# Patient Record
Sex: Female | Born: 1975 | Race: White | Hispanic: No | Smoking: Current every day smoker
Health system: Southern US, Community
[De-identification: ages and names within clinical notes are randomized; demographics above are authoritative.]

## PROBLEM LIST (undated history)

## (undated) DIAGNOSIS — K219 Gastro-esophageal reflux disease without esophagitis: Secondary | ICD-10-CM

## (undated) DIAGNOSIS — F32A Depression, unspecified: Secondary | ICD-10-CM

## (undated) DIAGNOSIS — J302 Other seasonal allergic rhinitis: Secondary | ICD-10-CM

## (undated) DIAGNOSIS — F419 Anxiety disorder, unspecified: Secondary | ICD-10-CM

## (undated) DIAGNOSIS — M1990 Unspecified osteoarthritis, unspecified site: Secondary | ICD-10-CM

## (undated) HISTORY — DX: Gastro-esophageal reflux disease without esophagitis: K21.9

## (undated) HISTORY — DX: Unspecified osteoarthritis, unspecified site: M19.90

## (undated) HISTORY — PX: OVARIAN CYST REMOVAL: SHX89

## (undated) HISTORY — DX: Other seasonal allergic rhinitis: J30.2

## (undated) HISTORY — PX: CHOLECYSTECTOMY: SHX55

## (undated) HISTORY — DX: Anxiety disorder, unspecified: F41.9

## (undated) HISTORY — DX: Depression, unspecified: F32.A

---

## 2002-06-01 ENCOUNTER — Emergency Department (HOSPITAL_COMMUNITY): Admission: EM | Admit: 2002-06-01 | Discharge: 2002-06-01 | Payer: Self-pay | Admitting: Emergency Medicine

## 2002-06-01 ENCOUNTER — Encounter: Payer: Self-pay | Admitting: Emergency Medicine

## 2003-01-20 ENCOUNTER — Emergency Department (HOSPITAL_COMMUNITY): Admission: EM | Admit: 2003-01-20 | Discharge: 2003-01-20 | Payer: Self-pay | Admitting: Emergency Medicine

## 2003-10-26 ENCOUNTER — Emergency Department (HOSPITAL_COMMUNITY): Admission: EM | Admit: 2003-10-26 | Discharge: 2003-10-26 | Payer: Self-pay | Admitting: Emergency Medicine

## 2005-12-19 ENCOUNTER — Emergency Department (HOSPITAL_COMMUNITY): Admission: EM | Admit: 2005-12-19 | Discharge: 2005-12-19 | Payer: Self-pay | Admitting: Emergency Medicine

## 2007-07-23 ENCOUNTER — Emergency Department: Payer: Self-pay | Admitting: Emergency Medicine

## 2013-12-03 ENCOUNTER — Emergency Department: Payer: Self-pay | Admitting: Emergency Medicine

## 2014-01-09 ENCOUNTER — Emergency Department: Payer: Self-pay | Admitting: Emergency Medicine

## 2014-01-09 LAB — CBC WITH DIFFERENTIAL/PLATELET
BASOS PCT: 0.2 %
Basophil #: 0 10*3/uL (ref 0.0–0.1)
EOS PCT: 3.2 %
Eosinophil #: 0.3 10*3/uL (ref 0.0–0.7)
HCT: 41.5 % (ref 35.0–47.0)
HGB: 14.3 g/dL (ref 12.0–16.0)
Lymphocyte #: 2.9 10*3/uL (ref 1.0–3.6)
Lymphocyte %: 36.5 %
MCH: 31.8 pg (ref 26.0–34.0)
MCHC: 34.6 g/dL (ref 32.0–36.0)
MCV: 92 fL (ref 80–100)
MONO ABS: 0.5 x10 3/mm (ref 0.2–0.9)
Monocyte %: 5.7 %
NEUTROS ABS: 4.4 10*3/uL (ref 1.4–6.5)
NEUTROS PCT: 54.4 %
PLATELETS: 183 10*3/uL (ref 150–440)
RBC: 4.51 10*6/uL (ref 3.80–5.20)
RDW: 12.9 % (ref 11.5–14.5)
WBC: 8 10*3/uL (ref 3.6–11.0)

## 2014-01-09 LAB — BASIC METABOLIC PANEL
Anion Gap: 5 — ABNORMAL LOW (ref 7–16)
BUN: 7 mg/dL (ref 7–18)
Calcium, Total: 8.6 mg/dL (ref 8.5–10.1)
Chloride: 105 mmol/L (ref 98–107)
Co2: 28 mmol/L (ref 21–32)
Creatinine: 0.75 mg/dL (ref 0.60–1.30)
EGFR (African American): >60
EGFR (Non-African Amer.): >60
GLUCOSE: 99 mg/dL (ref 65–99)
Osmolality: 274 (ref 275–301)
Potassium: 3.7 mmol/L (ref 3.5–5.1)
Sodium: 138 mmol/L (ref 136–145)

## 2014-01-09 LAB — SEDIMENTATION RATE: Erythrocyte Sed Rate: 4 mm/hr (ref 0–20)

## 2014-01-13 ENCOUNTER — Encounter: Payer: Self-pay | Admitting: Surgery

## 2014-01-16 ENCOUNTER — Emergency Department: Payer: Self-pay | Admitting: Emergency Medicine

## 2014-01-19 LAB — WOUND AEROBIC CULTURE

## 2014-02-07 ENCOUNTER — Encounter: Payer: Self-pay | Admitting: Surgery

## 2014-03-09 ENCOUNTER — Encounter: Payer: Self-pay | Admitting: Surgery

## 2014-06-07 ENCOUNTER — Emergency Department: Payer: Self-pay | Admitting: Internal Medicine

## 2014-08-12 IMAGING — CR RIGHT FOOT COMPLETE - 3+ VIEW
1 series · 3 of 3 positions shown · non-contrast
Comparison: 12/03/2013

CLINICAL DATA: Fell several weeks ago.  Right foot pain.

EXAM:
RIGHT FOOT COMPLETE - 3+ VIEW

[Series 1: x foot ap right · 0.14mm/px · 3 of 3 slices shown]
[im 1/3]
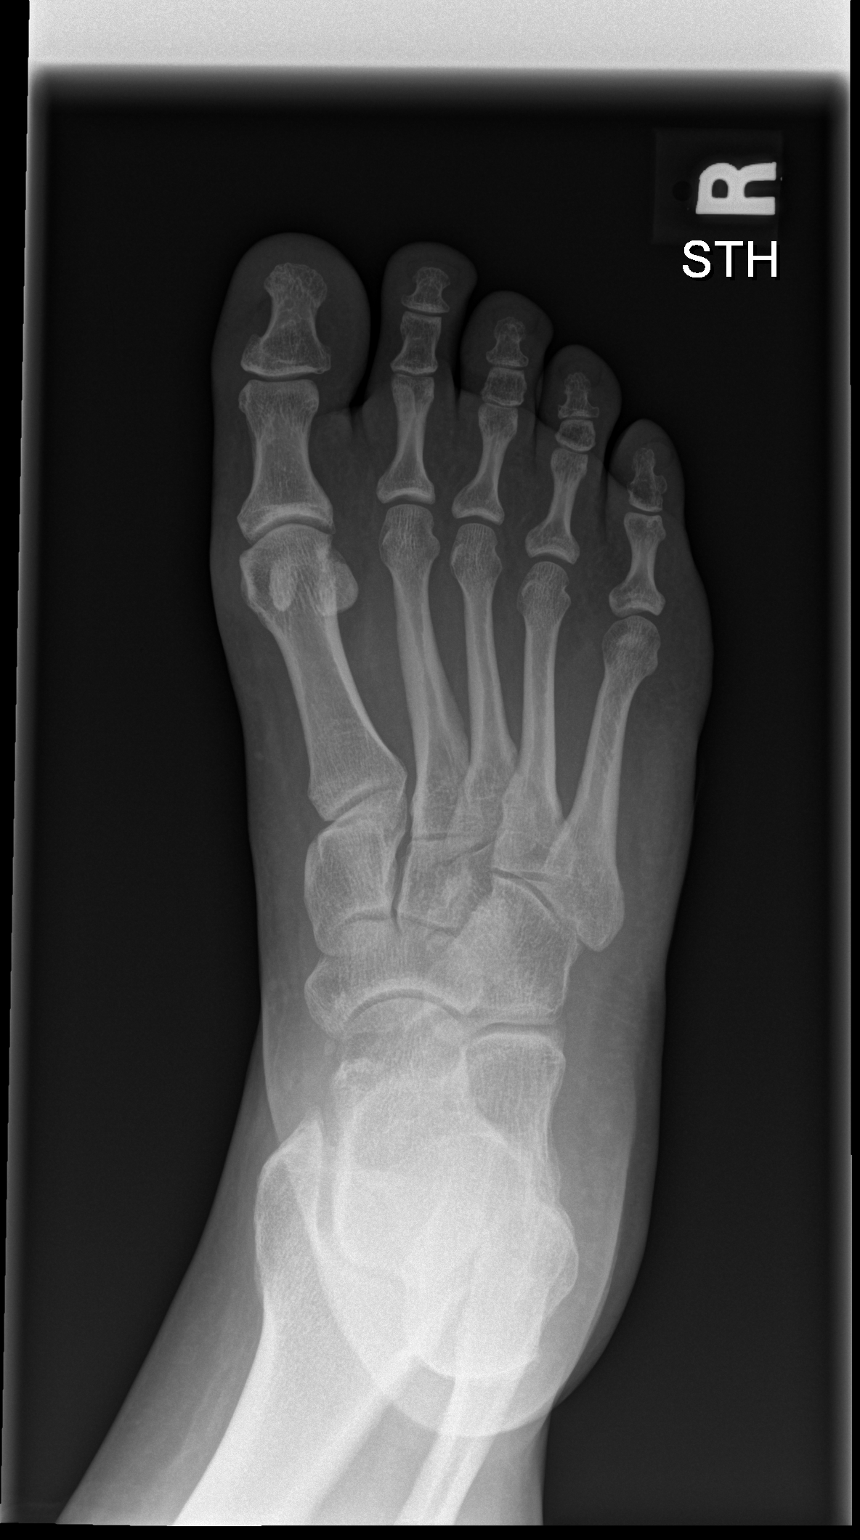
[im 2/3]
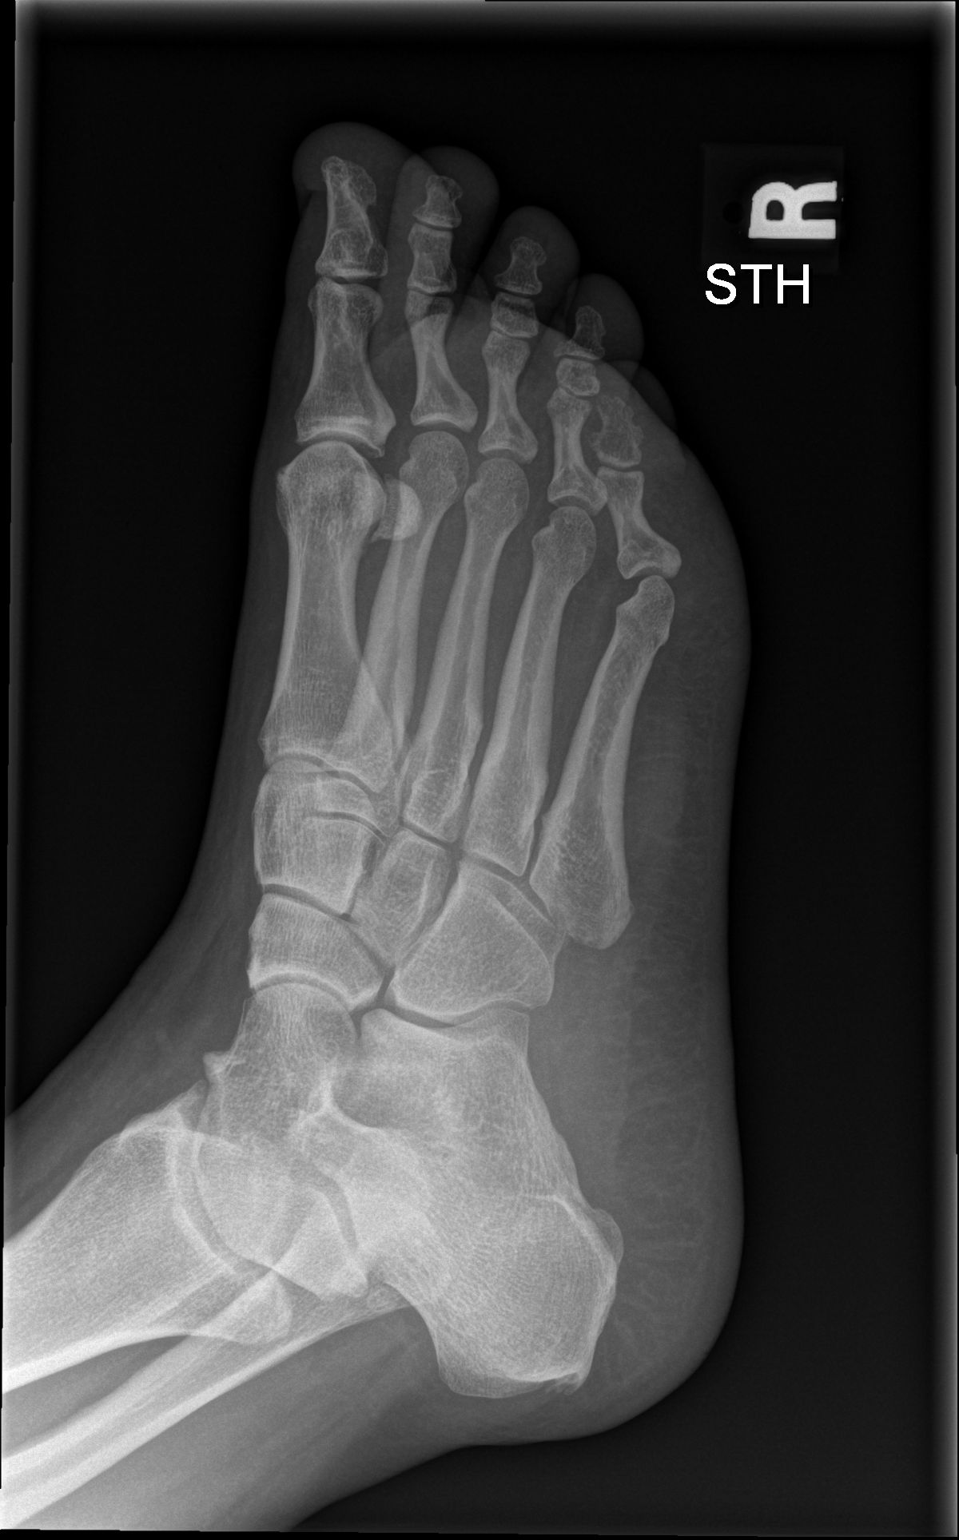
[im 3/3]
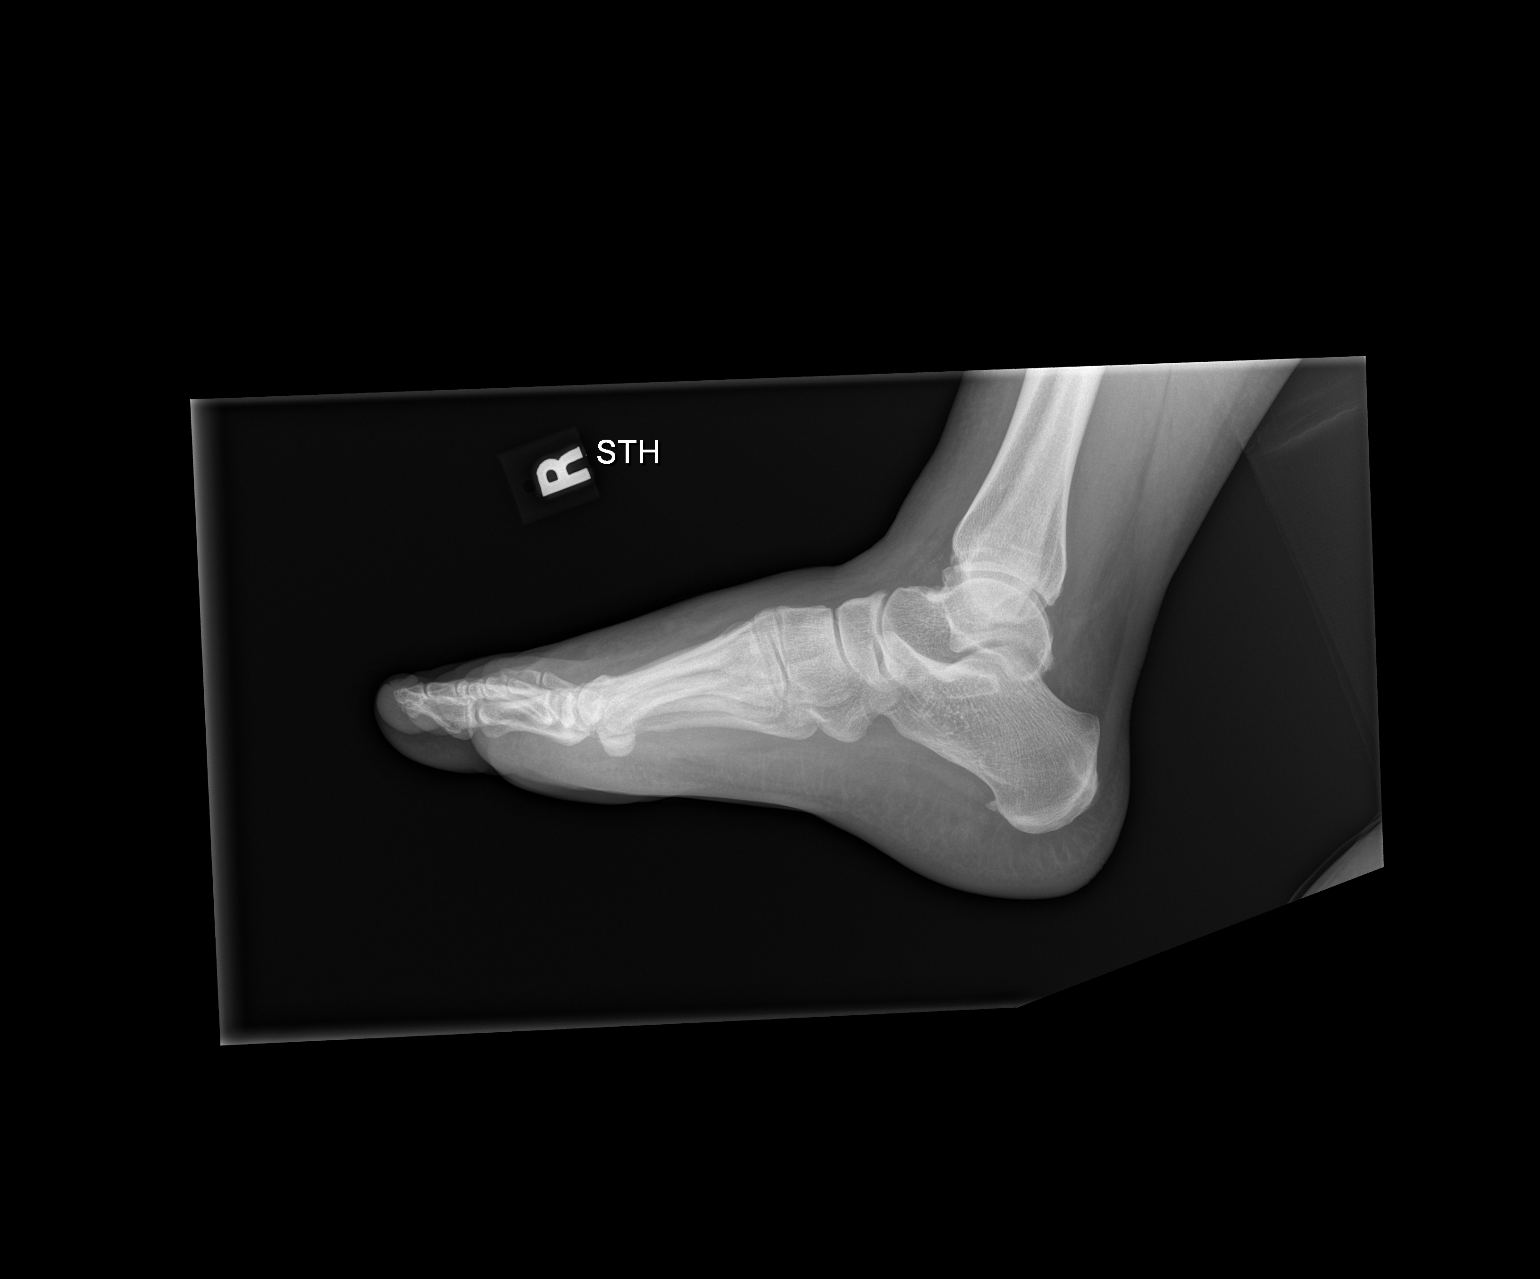

[3 of 3 positions shown; findings below may reference images not displayed]

FINDINGS: No fracture. There is minor joint space narrowing of the first
metatarsophalangeal joint with small marginal osteophytes consistent
with osteoarthritis. Remaining joints are normally spaced and
aligned. There is a small plantar calcaneal spur. Soft tissues are
unremarkable.
IMPRESSION: No fracture or acute finding.

## 2014-11-11 ENCOUNTER — Encounter (HOSPITAL_COMMUNITY): Payer: Self-pay | Admitting: Emergency Medicine

## 2014-11-11 ENCOUNTER — Emergency Department (HOSPITAL_COMMUNITY)
Admission: EM | Admit: 2014-11-11 | Discharge: 2014-11-11 | Disposition: A | Discharge status: Home / Self Care | Payer: Self-pay | Attending: Emergency Medicine | Admitting: Emergency Medicine

## 2014-11-11 DIAGNOSIS — Z880 Allergy status to penicillin: Secondary | ICD-10-CM | POA: Insufficient documentation

## 2014-11-11 DIAGNOSIS — Z9049 Acquired absence of other specified parts of digestive tract: Secondary | ICD-10-CM | POA: Insufficient documentation

## 2014-11-11 DIAGNOSIS — K529 Noninfective gastroenteritis and colitis, unspecified: Secondary | ICD-10-CM | POA: Insufficient documentation

## 2014-11-11 DIAGNOSIS — Z9889 Other specified postprocedural states: Secondary | ICD-10-CM | POA: Insufficient documentation

## 2014-11-11 DIAGNOSIS — Z720 Tobacco use: Secondary | ICD-10-CM | POA: Insufficient documentation

## 2014-11-11 LAB — CBC WITH DIFFERENTIAL/PLATELET
BASOS ABS: 0 10*3/uL (ref 0.0–0.1)
BASOS PCT: 0 % (ref 0–1)
EOS ABS: 0.2 10*3/uL (ref 0.0–0.7)
Eosinophils Relative: 2 % (ref 0–5)
HEMATOCRIT: 45.4 % (ref 36.0–46.0)
HEMOGLOBIN: 15.8 g/dL — AB (ref 12.0–15.0)
LYMPHS PCT: 11 % — AB (ref 12–46)
Lymphs Abs: 1.2 10*3/uL (ref 0.7–4.0)
MCH: 31.7 pg (ref 26.0–34.0)
MCHC: 34.8 g/dL (ref 30.0–36.0)
MCV: 91.2 fL (ref 78.0–100.0)
MONO ABS: 0.6 10*3/uL (ref 0.1–1.0)
MONOS PCT: 6 % (ref 3–12)
NEUTROS ABS: 8.8 10*3/uL — AB (ref 1.7–7.7)
Neutrophils Relative %: 81 % — ABNORMAL HIGH (ref 43–77)
Platelets: 181 10*3/uL (ref 150–400)
RBC: 4.98 MIL/uL (ref 3.87–5.11)
RDW: 12.4 % (ref 11.5–15.5)
WBC: 10.7 10*3/uL — AB (ref 4.0–10.5)

## 2014-11-11 LAB — HEPATIC FUNCTION PANEL
ALT: 16 U/L (ref 0–35)
AST: 24 U/L (ref 0–37)
Albumin: 4.2 g/dL (ref 3.5–5.2)
Alkaline Phosphatase: 42 U/L (ref 39–117)
BILIRUBIN DIRECT: 0.1 mg/dL (ref 0.0–0.5)
Indirect Bilirubin: 0.5 mg/dL (ref 0.3–0.9)
Total Bilirubin: 0.6 mg/dL (ref 0.3–1.2)
Total Protein: 7 g/dL (ref 6.0–8.3)

## 2014-11-11 LAB — BASIC METABOLIC PANEL
Anion gap: 6 (ref 5–15)
BUN: 10 mg/dL (ref 6–23)
CALCIUM: 8.8 mg/dL (ref 8.4–10.5)
CHLORIDE: 105 mmol/L (ref 96–112)
CO2: 27 mmol/L (ref 19–32)
CREATININE: 0.64 mg/dL (ref 0.50–1.10)
GFR calc non Af Amer: >90 mL/min (ref >90)
GLUCOSE: 103 mg/dL — AB (ref 70–99)
Potassium: 3.4 mmol/L — ABNORMAL LOW (ref 3.5–5.1)
Sodium: 138 mmol/L (ref 135–145)

## 2014-11-11 LAB — LIPASE, BLOOD: Lipase: 19 U/L (ref 11–59)

## 2014-11-11 MED ORDER — SODIUM CHLORIDE 0.9 % IV BOLUS (SEPSIS)
1000.0000 mL | Freq: Once | INTRAVENOUS | Status: AC
  Administered 2014-11-11: 1000 mL via INTRAVENOUS

## 2014-11-11 MED ORDER — PROMETHAZINE HCL 25 MG PO TABS: 25.0000 mg | ORAL_TABLET | Freq: Four times a day (QID) | ORAL | Status: AC | PRN

## 2014-11-11 MED ORDER — ONDANSETRON HCL 4 MG/2ML IJ SOLN
4.0000 mg | Freq: Once | INTRAMUSCULAR | Status: AC
  Administered 2014-11-11: 4 mg via INTRAVENOUS
  Filled 2014-11-11: qty 2

## 2014-11-11 MED ORDER — DIPHENOXYLATE-ATROPINE 2.5-0.025 MG PO TABS: 1.0000 | ORAL_TABLET | Freq: Four times a day (QID) | ORAL | Status: DC | PRN

## 2014-11-11 NOTE — ED Notes (Signed)
Patient states that she feels much better now. Education on discharge instructions with verbal understanding

## 2014-11-11 NOTE — Discharge Instructions (Signed)
Medication for nausea and diarrhea. Increase fluids. Rest. °

## 2014-11-11 NOTE — ED Provider Notes (Signed)
CSN: [id number]     Arrival date & time 11/11/14  1001 History  This chart was scribed for Donnetta HutchingBrian Dody Smartt, MD by Gwenyth Oberatherine Macek, ED Scribe. This patient was seen in room APA06/APA06 and the patient's care was started at 12:57 PM.    Chief Complaint  Patient presents with  . Abdominal Pain  . Emesis  . Diarrhea   The history is provided by the patient. No language interpreter was used.    HPI Comments: Jodi Cook is a 39 y.o. female with a history of cholecystectomy who presents to the Emergency Department complaining of constant, moderate upper abdominal pain that started earlier this morning. Pt notes 3 episodes of vomiting and 4-5 episodes of diarrhea as associated symptoms. Pt reports family members at home have the same symptoms. Severity is mild to moderate. Nothing makes symptoms better or worse.  History reviewed. No pertinent past medical history. Past Surgical History  Procedure Laterality Date  . Cholecystectomy    . Ovarian cyst removal     History reviewed. No pertinent family history. History  Substance Use Topics  . Smoking status: Current Every Day Smoker  . Smokeless tobacco: Not on file  . Alcohol Use: No   OB History    No data available     Review of Systems A complete 10 system review of systems was obtained and all systems are negative except as noted in the HPI and PMH.   Allergies  Penicillins  Home Medications   Prior to Admission medications   Medication Sig Start Date End Date Taking? Authorizing Provider  diphenoxylate-atropine (LOMOTIL) 2.5-0.025 MG per tablet Take 1 tablet by mouth 4 (four) times daily as needed for diarrhea or loose stools. 11/11/14   Donnetta HutchingBrian Yolunda Kloos, MD  promethazine (PHENERGAN) 25 MG tablet Take 1 tablet (25 mg total) by mouth every 6 (six) hours as needed. 11/11/14   Donnetta HutchingBrian Idali Lafever, MD   BP 114/72 mmHg  Pulse 87  Temp(Src) 98.2 F (36.8 C) (Oral)  Resp 16  Ht 5\' 4"  (1.626 m)  Wt 215 lb (97.523 kg)  BMI 36.89 kg/m2  SpO2 99%   LMP 10/13/2014 Physical Exam  Constitutional: She is oriented to person, place, and time. She appears well-developed and well-nourished.  HENT:  Head: Normocephalic and atraumatic.  Eyes: Conjunctivae and EOM are normal. Pupils are equal, round, and reactive to light.  Neck: Normal range of motion. Neck supple.  Cardiovascular: Normal rate and regular rhythm.   Pulmonary/Chest: Effort normal and breath sounds normal.  Abdominal: Soft. Bowel sounds are normal. There is tenderness.  Slight epigastric tenderness  Musculoskeletal: Normal range of motion.  Neurological: She is alert and oriented to person, place, and time.  Skin: Skin is warm and dry.  Psychiatric: She has a normal mood and affect. Her behavior is normal.  Nursing note and vitals reviewed.   ED Course  Procedures   DIAGNOSTIC STUDIES: Oxygen Saturation is 99% on RA, normal by my interpretation.    COORDINATION OF CARE: 1:07 PM Discussed treatment plan with pt which includes IV fluids and Zofran. Pt agreed to plan.   Labs Review Labs Reviewed  CBC WITH DIFFERENTIAL/PLATELET - Abnormal; Notable for the following:    WBC 10.7 (*)    Hemoglobin 15.8 (*)    Neutrophils Relative % 81 (*)    Neutro Abs 8.8 (*)    Lymphocytes Relative 11 (*)    All other components within normal limits  BASIC METABOLIC PANEL - Abnormal; Notable for the  following:    Potassium 3.4 (*)    Glucose, Bld 103 (*)    All other components within normal limits  HEPATIC FUNCTION PANEL  LIPASE, BLOOD    Imaging Review No results found.   EKG Interpretation None      MDM   Final diagnoses:  Gastroenteritis    History and physical consistent with gastroenteritis. Patient feels better after IV fluids and Zofran. Discharge medications Lomotil and then again 25 mg.         I personally performed the services described in this documentation, which was scribed in my presence. The recorded information has been reviewed and is  accurate.    Donnetta Hutching, MD 11/11/14 [phone]

## 2014-11-11 NOTE — ED Notes (Signed)
Patient complaining of abdominal pain with vomiting and diarrhea starting this morning.

## 2022-03-25 ENCOUNTER — Other Ambulatory Visit: Payer: Self-pay

## 2022-03-25 DIAGNOSIS — Z1231 Encounter for screening mammogram for malignant neoplasm of breast: Secondary | ICD-10-CM

## 2022-03-26 ENCOUNTER — Other Ambulatory Visit: Payer: Self-pay | Admitting: *Deleted

## 2022-03-26 ENCOUNTER — Ambulatory Visit
Admission: RE | Admit: 2022-03-26 | Discharge: 2022-03-26 | Disposition: A | Discharge status: Home / Self Care | Payer: Self-pay | Source: Ambulatory Visit | Attending: Obstetrics and Gynecology | Admitting: Obstetrics and Gynecology

## 2022-03-26 ENCOUNTER — Ambulatory Visit: Payer: Self-pay | Attending: Hematology and Oncology | Admitting: *Deleted

## 2022-03-26 ENCOUNTER — Inpatient Hospital Stay
Admission: RE | Admit: 2022-03-26 | Discharge: 2022-03-26 | Disposition: A | Discharge status: Home / Self Care | Payer: Self-pay | Source: Ambulatory Visit | Attending: *Deleted | Admitting: *Deleted

## 2022-03-26 VITALS — Wt 245.4 lb

## 2022-03-26 DIAGNOSIS — Z01419 Encounter for gynecological examination (general) (routine) without abnormal findings: Secondary | ICD-10-CM

## 2022-03-26 DIAGNOSIS — Z1231 Encounter for screening mammogram for malignant neoplasm of breast: Secondary | ICD-10-CM | POA: Insufficient documentation

## 2022-03-26 NOTE — Progress Notes (Signed)
[name] is a 46 y.o. female who presents to [facility] clinic today with no complaints. She presents for clinical breast exam and mammogam.    Pap Smear: Pap not smear completed today. Last Pap smear was on 12/14/20 at a [place] [facility] and was normal with negative / negative results. Per patient has no history of an abnormal Pap smear. Last Pap smear result is available in Epic.   Physical exam: Breasts Breasts symmetrical. No skin abnormalities bilateral breasts. No nipple retraction bilateral breasts. No nipple discharge bilateral breasts. No lymphadenopathy. No lumps palpated bilateral breasts.       Pelvic/Bimanual Pap is not indicated today    Smoking History: Patient is a current smoker.  She was referred to quit line.    Patient Navigation: Patient education provided. Access to services provided for patient through [facility] program. No interpreter provided. No transportation provided   Colorectal Cancer Screening: Per patient has never had colonoscopy completed.  No complaints today. She does not meet screening criteria based on age.   Breast and Cervical Cancer Risk Assessment: Patient does not have family history of breast cancer, known genetic mutations, or radiation treatment to the chest before age 82. Patient does not have history of cervical dysplasia, immunocompromised, or DES exposure in-utero.  Risk Assessment     Risk Scores       03/26/2022   Last edited by: Narda Rutherford, LPN   5-year risk: 0.9 %   Lifetime risk: 11.7 %            A: [facility] exam without pap smear  P: Referred patient to the [facility] for a screening mammogram. Appointment scheduled today.Jim Like, RN 03/26/2022 11:28 AM

## 2022-12-11 ENCOUNTER — Emergency Department (HOSPITAL_COMMUNITY): Payer: Self-pay

## 2022-12-11 ENCOUNTER — Other Ambulatory Visit: Payer: Self-pay

## 2022-12-11 ENCOUNTER — Encounter (HOSPITAL_COMMUNITY): Payer: Self-pay

## 2022-12-11 ENCOUNTER — Emergency Department (HOSPITAL_COMMUNITY)
Admission: EM | Admit: 2022-12-11 | Discharge: 2022-12-11 | Disposition: A | Discharge status: Home / Self Care | Payer: Self-pay | Attending: Emergency Medicine | Admitting: Emergency Medicine

## 2022-12-11 DIAGNOSIS — Z91040 Latex allergy status: Secondary | ICD-10-CM | POA: Insufficient documentation

## 2022-12-11 DIAGNOSIS — R600 Localized edema: Secondary | ICD-10-CM | POA: Insufficient documentation

## 2022-12-11 LAB — HEPATIC FUNCTION PANEL
ALT: 18 U/L (ref 0–44)
AST: 19 U/L (ref 15–41)
Albumin: 3.7 g/dL (ref 3.5–5.0)
Alkaline Phosphatase: 47 U/L (ref 38–126)
Bilirubin, Direct: 0.1 mg/dL (ref 0.0–0.2)
Indirect Bilirubin: 0.4 mg/dL (ref 0.3–0.9)
Total Bilirubin: 0.5 mg/dL (ref 0.3–1.2)
Total Protein: 6.4 g/dL — ABNORMAL LOW (ref 6.5–8.1)

## 2022-12-11 LAB — CBC WITH DIFFERENTIAL/PLATELET
Abs Immature Granulocytes: 0.01 10*3/uL (ref 0.00–0.07)
Basophils Absolute: 0 10*3/uL (ref 0.0–0.1)
Basophils Relative: 0 %
Eosinophils Absolute: 0.4 10*3/uL (ref 0.0–0.5)
Eosinophils Relative: 4 %
HCT: 37.7 % (ref 36.0–46.0)
Hemoglobin: 12.8 g/dL (ref 12.0–15.0)
Immature Granulocytes: 0 %
Lymphocytes Relative: 34 %
Lymphs Abs: 3.3 10*3/uL (ref 0.7–4.0)
MCH: 31.1 pg (ref 26.0–34.0)
MCHC: 34 g/dL (ref 30.0–36.0)
MCV: 91.5 fL (ref 80.0–100.0)
Monocytes Absolute: 0.8 10*3/uL (ref 0.1–1.0)
Monocytes Relative: 8 %
Neutro Abs: 5.1 10*3/uL (ref 1.7–7.7)
Neutrophils Relative %: 54 %
Platelets: 246 10*3/uL (ref 150–400)
RBC: 4.12 MIL/uL (ref 3.87–5.11)
RDW: 12.7 % (ref 11.5–15.5)
WBC: 9.6 10*3/uL (ref 4.0–10.5)
nRBC: 0 % (ref 0.0–0.2)

## 2022-12-11 LAB — BASIC METABOLIC PANEL
Anion gap: 8 (ref 5–15)
BUN: 9 mg/dL (ref 6–20)
CO2: 24 mmol/L (ref 22–32)
Calcium: 8.6 mg/dL — ABNORMAL LOW (ref 8.9–10.3)
Chloride: 104 mmol/L (ref 98–111)
Creatinine, Ser: 0.55 mg/dL (ref 0.44–1.00)
GFR, Estimated: >60 mL/min (ref >60)
Glucose, Bld: 80 mg/dL (ref 70–99)
Potassium: 3.4 mmol/L — ABNORMAL LOW (ref 3.5–5.1)
Sodium: 136 mmol/L (ref 135–145)

## 2022-12-11 LAB — BRAIN NATRIURETIC PEPTIDE: B Natriuretic Peptide: 13 pg/mL (ref 0.0–100.0)

## 2022-12-11 MED ORDER — POTASSIUM CHLORIDE CRYS ER 20 MEQ PO TBCR
40.0000 meq | EXTENDED_RELEASE_TABLET | Freq: Once | ORAL | Status: AC
  Administered 2022-12-11: 40 meq via ORAL
  Filled 2022-12-11: qty 2

## 2022-12-11 MED ORDER — FUROSEMIDE 40 MG PO TABS
20.0000 mg | ORAL_TABLET | Freq: Once | ORAL | Status: AC
  Administered 2022-12-11: 20 mg via ORAL
  Filled 2022-12-11: qty 1

## 2022-12-11 NOTE — ED Triage Notes (Signed)
Pt from home with c/o bilateral LE edema, worse on right, pt says swelling started in January after having IUD placed. Pt reports going to ED in [place] where she resides and they told her to be more active and that her blood work and EKG were fine. Pt unable to get apt with OB/GYN until 3 weeks from now

## 2022-12-11 NOTE — ED Provider Notes (Signed)
[place] EMERGENCY DEPARTMENT AT [facility] Provider Note   CSN: [id number] Arrival date & time: 12/11/22  1534     History Chief Complaint  Patient presents with   Leg Swelling    Jodi Cook is a 47 y.o. female with h/o abnormal uterine bleeding presents to the ER for evaluation of her bilateral leg edema for the past 6 weeks. She reports that she thinks this is from her IUD placement in January. She has already been seen by another ER and had a work up but she is still concerned as she reports that this has never been a problem for her. She reports that they are both achy and are worse throughout the day. She denies any chest pain or any SOB. She denies any injury or trauma to the area. She thinks her right leg is more swollen that her left. She denies any recent long travel, h/o cancer, h/o DVT/PE, exogenous estrogen use, hemoptysis. She is a daily tobacco user.   HPI     Home Medications Prior to Admission medications   Medication Sig Start Date End Date Taking? Authorizing Provider  diphenoxylate-atropine (LOMOTIL) 2.5-0.025 MG per tablet Take 1 tablet by mouth 4 (four) times daily as needed for diarrhea or loose stools. 11/11/14   Donnetta Hutching, MD  escitalopram (LEXAPRO) 10 MG tablet Take 10 mg by mouth daily. 01/28/22   [provider]  fexofenadine (ALLEGRA) 180 MG tablet Take 180 mg by mouth daily. 02/27/22   [provider]  promethazine (PHENERGAN) 25 MG tablet Take 1 tablet (25 mg total) by mouth every 6 (six) hours as needed. 11/11/14   Donnetta Hutching, MD  vitamin B-12 (CYANOCOBALAMIN) 100 MCG tablet Take 100 mcg by mouth daily. 01/30/22   [provider]  Vitamin D, Ergocalciferol, (DRISDOL) 1.25 MG (50000 UNIT) CAPS capsule Take 50,000 Units by mouth once a week. 01/30/22   [provider]      Allergies    Citalopram, Penicillins, Buspirone, and Latex    Review of Systems   Review of Systems  Constitutional:  Negative for  chills and fever.  Respiratory:  Negative for shortness of breath.   Cardiovascular:  Positive for leg swelling. Negative for chest pain.  Skin:  Negative for rash and wound.    Physical Exam Updated Vital Signs BP 127/83 (BP Location: Right Arm)   Pulse 92   Temp 97.7 F (36.5 C) (Oral)   Resp 20   Ht 5\' 4"  (1.626 m)   Wt 113.4 kg   SpO2 99%   BMI 42.91 kg/m  Physical Exam Vitals and nursing note reviewed.  Constitutional:      General: She is not in acute distress.    Appearance: Normal appearance. She is not ill-appearing or toxic-appearing.  Eyes:     General: No scleral icterus. Cardiovascular:     Rate and Rhythm: Normal rate.  Pulmonary:     Effort: Pulmonary effort is normal. No respiratory distress.     Breath sounds: Normal breath sounds.  Musculoskeletal:     Right lower leg: Edema present.     Left lower leg: Edema present.     Comments: Chronic appearing lymphedema in the lower legs and dorsum of the feet. Trace pitting edema. No overlying skin changes, warmth or wounds. Coloration and temperature appear and feel symmetric. Palpable DP and PT pulses. Strength intact. Compartments firm, but pliable in the lower extremities. Cap refill brisk. Sensation subjectively intact.   Skin:  General: Skin is dry.     Findings: No rash.  Neurological:     General: No focal deficit present.     Mental Status: She is alert. Mental status is at baseline.  Psychiatric:        Mood and Affect: Mood normal.     ED Results / Procedures / Treatments   Labs (all labs ordered are listed, but only abnormal results are displayed) Labs Reviewed  BASIC METABOLIC PANEL - Abnormal; Notable for the following components:      Result Value   Potassium 3.4 (*)    Calcium 8.6 (*)    All other components within normal limits  HEPATIC FUNCTION PANEL - Abnormal; Notable for the following components:   Total Protein 6.4 (*)    All other components within normal limits  CBC WITH  DIFFERENTIAL/PLATELET  BRAIN NATRIURETIC PEPTIDE    EKG None  Radiology US Venous Img Lower Unilateral Right  Result Date: 12/11/2022 CLINICAL DATA:  right lower leg pain and swelling EXAM: RIGHT LOWER EXTREMITY VENOUS DOPPLER ULTRASOUND TECHNIQUE: Gray-scale sonography with compression, as well as color and duplex ultrasound, were performed to evaluate the deep venous system(s) from the level of the common femoral vein through the popliteal and proximal calf veins. COMPARISON:  RIGHT lower extremity XRs, 01/09/2014 FINDINGS: VENOUS Normal compressibility of the RIGHT common femoral, superficial femoral, and popliteal veins, as well as the visualized calf veins. Visualized portions of profunda femoral vein and great saphenous vein unremarkable. No filling defects to suggest DVT on grayscale or color Doppler imaging. Doppler waveforms show normal direction of venous flow, normal respiratory plasticity and response to augmentation. Limited views of the contralateral common femoral vein are unremarkable. OTHER No evidence of superficial thrombophlebitis. At the posterior RIGHT knee within the fossa is a well-circumscribed anechoic and avascular fluid collection measuring approximately 3.1 x 0.9 x 2.8 cm, consistent with a popliteal fossa/Baker cyst. Limitations: none IMPRESSION: 1. No evidence of femoropopliteal DVT or superficial thrombophlebitis within the RIGHT lower extremity. 2. 3 cm RIGHT popliteal fossa/Baker cyst. Roanna BanningJon Mugweru, MD [facility] Electronically Signed   By: Roanna BanningJon  Mugweru M.D.   On: 12/11/2022 17:41    Procedures Procedures   Medications Ordered in ED Medications  furosemide (LASIX) tablet 20 mg (20 mg Oral Given 12/11/22 1820)  potassium chloride SA (KLOR-CON M) CR tablet 40 mEq (40 mEq Oral Given 12/11/22 1819)    ED Course/ Medical Decision Making/ A&P                            Medical Decision Making Amount and/or  Complexity of Data Reviewed Labs: ordered.  Risk Prescription drug management.   47 y.o. female presents to the ER for evaluation of bialteral leg swelling. Differential diagnosis includes but is not limited to venous stasis, DVT, CHF exacerbation, volume overload. Vital signs unremarkable. Physical exam as noted above.   Patient has been seen for this multiple time including orthopedic and the emergency department which she reportedly had normal workups.  She is still concerned as she thinks this is from her IUD causing this.  She reports that someone told her that progesterone will cause peripheral swelling.  Will order basic labs.  She reports that she did not have a DVT study at the ER last time so we will order DVT of the right leg.  I independently reviewed and interpreted the patient's labs.  CBC without  leukocytosis or anemia.  BMP shows mildly decreased potassium at 3.4.  Mildly decreased calcium 8.6.  Hepatic function shows mildly decreased total protein at 6.4.  Otherwise, no electrolyte or LFT abnormality.  BNP within normal limits.  DVT study shows 1. No evidence of femoropopliteal DVT or superficial thrombophlebitis within the RIGHT lower extremity. 2. 3 cm RIGHT popliteal fossa/Baker cyst.   After consideration of the diagnostic results and the patients response to treatment, I feel that the patient can be discharged with outpatient follow up.  With normal BNP without any chest pain or shortness of breath mobilization for any CHF exacerbation.  Low suspicion for any PE.  Other than some mild hypokalemia, her workup is otherwise normal.  Discussed with my attending, will order her small dose of Lasix as well as some potassium.  Patient is overweight and lives a sedentary lifestyle, likely more venous stasis.  Recommended she become more active and wear compression socks and also elevate her feet above her head to help with the swelling.  I discussed with her she feels that the Lasix  helps to her she can follow-up with her primary care doctor to be placed on a prescription.  She is to follow-up with her OB/GYN within the week for possible removal of her IUD.  I do the patient that I do not think that the progesterone is causing her swelling however she should still follow-up with her OB/GYN providers will have more information on this.  Legs do not have any wounds or any signs of cellulitis.  She has got palpable pulses and brisk cap refill.  We discussed the results of the labs/imaging. The plan is follow-up PCP, wear compression stockings, elevate legs. We discussed strict return precautions and red flag symptoms. The patient verbalized their understanding and agrees to the plan. The patient is stable and being discharged home in good condition.  Portions of this report may have been transcribed using voice recognition software. Every effort was made to ensure accuracy; however, inadvertent computerized transcription errors may be present.   Final Clinical Impression(s) / ED Diagnoses Final diagnoses:  Peripheral edema    Rx / DC Orders ED Discharge Orders     None         Achille RichRansom, Haden Cavenaugh, PA-C 12/15/22 0720    Lonell GrandchildScheving, William L, MD 12/20/22 [phone]

## 2022-12-11 NOTE — Discharge Instructions (Signed)
You were seen in the ER for evaluation of your lower leg swelling. Your potassium was low, but otherwise your lab work was unremarkable. Your [place] did not show a DVT, but did show a baker cyst. I do no think this is what is causing your symptoms, but I would follow up with your ortho provider about this. Additionally, please follow up with your PCP for further evaluation in the next few days. If you have any concerns, new or worsening symptoms, please return to the nearest ER for re-evaluation.   Contact a health care provider if: You have a fever. You have swelling in only one leg. You have increased swelling, redness, or pain in one or both of your legs. You have drainage or sores at the area where you have edema. Get help right away if: You have edema that starts suddenly or is getting worse, especially if you are pregnant or have a medical condition. You develop shortness of breath, especially when you are lying down. You have pain in your chest or abdomen. You feel weak. You feel like you will faint. These symptoms may be an emergency. Get help right away. Call 911. Do not wait to see if the symptoms will go away. Do not drive yourself to the hospital.

## 2024-05-20 ENCOUNTER — Ambulatory Visit: Admitting: Adult Health

## 2024-05-21 ENCOUNTER — Encounter: Payer: Self-pay | Admitting: Nurse Practitioner

## 2024-05-21 ENCOUNTER — Ambulatory Visit (INDEPENDENT_AMBULATORY_CARE_PROVIDER_SITE_OTHER): Admitting: Nurse Practitioner

## 2024-05-21 VITALS — BP 106/68 | HR 73 | Temp 98.1°F | Ht 64.0 in | Wt 260.4 lb

## 2024-05-21 DIAGNOSIS — G4719 Other hypersomnia: Secondary | ICD-10-CM | POA: Diagnosis not present

## 2024-05-21 DIAGNOSIS — F17210 Nicotine dependence, cigarettes, uncomplicated: Secondary | ICD-10-CM

## 2024-05-21 DIAGNOSIS — F419 Anxiety disorder, unspecified: Secondary | ICD-10-CM | POA: Diagnosis not present

## 2024-05-21 DIAGNOSIS — F17200 Nicotine dependence, unspecified, uncomplicated: Secondary | ICD-10-CM | POA: Insufficient documentation

## 2024-05-21 NOTE — Assessment & Plan Note (Signed)
 Stable. Associated with life stressors/events. No SI. Suspect this is playing a role in her sleep quality and daytime fatigue as well. Her medication regimen is also causing fatigue, which we discussed today. Encouraged her to follow up with psychiatry or PCP

## 2024-05-21 NOTE — Patient Instructions (Addendum)

## 2024-05-21 NOTE — Assessment & Plan Note (Addendum)
 BMI 44. Healthy weight loss measures. Appropriate candidate for GLP-1 therapy. Will readdress after her HST, as Zepbound would be indicated in OSA. Reviewed small measures to reduce calorie intake, including switching to diet sodas or flavored water.

## 2024-05-21 NOTE — Assessment & Plan Note (Signed)
 She has excessive daytime sleepiness, restless sleep. BMI 44. Given this,  I am concerned she could have sleep disordered breathing with obstructive sleep apnea. She will need sleep study for further evaluation.    - discussed how weight can impact sleep and risk for sleep disordered breathing - discussed options to assist with weight loss: combination of diet modification, cardiovascular and strength training exercises   - had an extensive discussion regarding the adverse health consequences related to untreated sleep disordered breathing - specifically discussed the risks for hypertension, coronary artery disease, cardiac dysrhythmias, cerebrovascular disease, and diabetes - lifestyle modification discussed   - discussed how sleep disruption can increase risk of accidents, particularly when driving - safe driving practices were discussed  Patient Instructions  Given your symptoms, I am concerned that you may have sleep disordered breathing with sleep apnea. You will need a sleep study for further evaluation. Someone will contact you to schedule this.   We discussed how untreated sleep apnea puts an individual at risk for cardiac arrhthymias, pulm HTN, DM, stroke and increases their risk for daytime accidents. We also briefly reviewed treatment options including weight loss, side sleeping position, oral appliance, CPAP therapy or referral to ENT for possible surgical options  Use caution when driving and pull over if you become sleepy.  Follow up in 6 weeks with Katie Isaack Preble,NP to go over sleep study results, or sooner, if needed. Friday PM virtual clinic preferred

## 2024-05-21 NOTE — Progress Notes (Signed)
 @Patient  ID: Jodi Cook, female    DOB: 07-Mar-1976, 48 y.o.   MRN: [id number]  Chief Complaint  Patient presents with   Consult    Frequent awakening during night, anxiety.  Never had sleep study    Referring provider: Leigh Pao, FNP  HPI: 48 year old female, active smoker referred for sleep consult. Past medical history significant for anxiety, allergic rhinitis, GERD.   TEST/EVENTS:   05/21/2024: Today - sleep consult Discussed the use of AI scribe software for clinical note transcription with the patient, who gave verbal consent to proceed.  History of Present Illness Jodi Cook is a 48 year old female who presents with sleep disturbances and daytime fatigue.  She experiences restless sleep at night, characterized by frequent awakenings and daytime fatigue. She's unsure if she snores. No morning headaches, drowsy driving, sleepwalking, or sleep paralysis. She occasionally takes trazodone for sleep, but finds it inconsistently effective.  She takes Auvelity once a day, which causes drowsiness and excessive sweating. Fatigue was present prior to starting this medication but has worsened since.  She has a larger chest contributing to discomfort and difficulty sleeping, as it feels like 'two hundred pounds is sitting on my chest.' She can only sleep on her sides due to the pressure. Planning to talk to a plastic surgeon about this.   She has arthritis in her knees, complicating her ability to exercise, and is aware of the need for knee replacements. This condition, along with her weight, makes physical activity challenging.  Her social history includes smoking, which she acknowledges needing to quit, especially given her family history of COPD. She drinks a two-liter bottle of soda over two to three days, having switched from [facility] to Dr. Nunzio after COVID altered her taste preferences.  Her family history is significant for COPD, as her mother passed away from  it. She also mentions the emotional toll of losing her parents and grandmother, impacting her mental health and lifestyle. Feels this may play into her fatigue.   She goes to bed around 11pm-midnight. Falls asleep quickly. Wakes 4-5 times a night. Gets up at 6 am. Weight is up 15 lb in last 2 years. 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 No former sleep study. 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 Lives with her boyfriend. No sleep aids. Has taken trazodone in the past but didn't work well for her.   Epworth 5    Allergies  Allergen Reactions   Citalopram Other (See Comments)    AND HIVES WITH SWELLING   Penicillins Hives, Anaphylaxis, Shortness Of Breath and Swelling    Of tongue and face  AND SWELLING   Buspirone Other (See Comments)   Latex Rash     There is no immunization history on file for this patient.  No past medical history on file.  Tobacco History: Social History   Tobacco Use  Smoking Status Every Day  Smokeless Tobacco Not on file   Ready to quit: Not Answered Counseling given: Not Answered   Outpatient Medications Prior to Visit  Medication Sig Dispense Refill   Ascorbic Acid (VITAMIN C PO) Take 1 tablet by mouth daily.     Dextromethorphan-buPROPion ER (AUVELITY) 45-105 MG TBCR Take 1 tablet by mouth at bedtime.     escitalopram (LEXAPRO) 10 MG tablet Take 10 mg by mouth daily.     fexofenadine (ALLEGRA) 180 MG tablet Take 180 mg by mouth daily.  omeprazole (PRILOSEC) 40 MG capsule Take 40 mg by mouth daily.     promethazine  (PHENERGAN ) 25 MG tablet Take 1 tablet (25 mg total) by mouth every 6 (six) hours as needed. 15 tablet 0   vitamin B-12 (CYANOCOBALAMIN) 100 MCG tablet Take 100 mcg by mouth daily.     Vitamin D, Ergocalciferol, (DRISDOL) 1.25 MG (50000 UNIT) CAPS capsule Take 50,000 Units by mouth once a week.     diphenoxylate -atropine  (LOMOTIL ) 2.5-0.025 MG per tablet Take 1 tablet by mouth 4 (four) times daily as needed for diarrhea or  loose stools. 20 tablet 0   No facility-administered medications prior to visit.     Review of Systems: As above    Physical Exam:  BP 106/68   Pulse 73   Temp 98.1 F (36.7 C) (Oral)   Ht 5' 4 (1.626 m)   Wt 260 lb 6.4 oz (118.1 kg)   SpO2 99% Comment: room air  BMI 44.70 kg/m   GEN: Pleasant, interactive, well-kempt; morbidly obese; in no acute distress HEENT:  Normocephalic and atraumatic. PERRLA. Sclera white. Nasal turbinates pink, moist and patent bilaterally. No rhinorrhea present. Oropharynx pink and moist, without exudate or edema. No lesions, ulcerations, or postnasal drip. Mallampati III/IV NECK:  Supple w/ fair ROM. No JVD present. Thyroid symmetrical with no goiter or nodules palpated. No lymphadenopathy.   CV: RRR, no m/r/g, no peripheral edema. Pulses intact, +2 bilaterally. No cyanosis, pallor or clubbing. PULMONARY:  Unlabored, regular breathing. Clear bilaterally A&P w/o wheezes/rales/rhonchi. No accessory muscle use.  GI: BS present and normoactive. Soft, non-tender to palpation.  MSK: No erythema, warmth or tenderness. Cap refil <2 sec all extrem.  Neuro: A/Ox3. No focal deficits noted.   Skin: Warm, no lesions or rashe Psych: Normal affect and behavior. Judgement and thought content appropriate.     Lab Results:  CBC    Component Value Date/Time   WBC 9.6 12/11/2022 1655   RBC 4.12 12/11/2022 1655   HGB 12.8 12/11/2022 1655   HGB 14.3 01/09/2014 1158   HCT 37.7 12/11/2022 1655   HCT 41.5 01/09/2014 1158   PLT 246 12/11/2022 1655   PLT 183 01/09/2014 1158   MCV 91.5 12/11/2022 1655   MCV 92 01/09/2014 1158   MCH 31.1 12/11/2022 1655   MCHC 34.0 12/11/2022 1655   RDW 12.7 12/11/2022 1655   RDW 12.9 01/09/2014 1158   LYMPHSABS 3.3 12/11/2022 1655   LYMPHSABS 2.9 01/09/2014 1158   MONOABS 0.8 12/11/2022 1655   MONOABS 0.5 01/09/2014 1158   EOSABS 0.4 12/11/2022 1655   EOSABS 0.3 01/09/2014 1158   BASOSABS 0.0 12/11/2022 1655   BASOSABS  0.0 01/09/2014 1158    BMET    Component Value Date/Time   NA 136 12/11/2022 1655   NA 138 01/09/2014 1158   K 3.4 (L) 12/11/2022 1655   K 3.7 01/09/2014 1158   CL 104 12/11/2022 1655   CL 105 01/09/2014 1158   CO2 24 12/11/2022 1655   CO2 28 01/09/2014 1158   GLUCOSE 80 12/11/2022 1655   GLUCOSE 99 01/09/2014 1158   BUN 9 12/11/2022 1655   BUN 7 01/09/2014 1158   CREATININE 0.55 12/11/2022 1655   CREATININE 0.75 01/09/2014 1158   CALCIUM 8.6 (L) 12/11/2022 1655   CALCIUM 8.6 01/09/2014 1158   GFRNONAA >60 12/11/2022 1655   GFRNONAA >60 01/09/2014 1158   GFRAA >90 11/11/2014 1110   GFRAA >60 01/09/2014 1158    BNP    Component Value  Date/Time   BNP 13.0 12/11/2022 1654     Imaging:  No results found.  Administration History     None           No data to display          No results found for: NITRICOXIDE      Assessment & Plan:   Excessive daytime sleepiness She has excessive daytime sleepiness, restless sleep. BMI 44. Given this,  I am concerned she could have sleep disordered breathing with obstructive sleep apnea. She will need sleep study for further evaluation.    - discussed how weight can impact sleep and risk for sleep disordered breathing - discussed options to assist with weight loss: combination of diet modification, cardiovascular and strength training exercises   - had an extensive discussion regarding the adverse health consequences related to untreated sleep disordered breathing - specifically discussed the risks for hypertension, coronary artery disease, cardiac dysrhythmias, cerebrovascular disease, and diabetes - lifestyle modification discussed   - discussed how sleep disruption can increase risk of accidents, particularly when driving - safe driving practices were discussed  Patient Instructions  Given your symptoms, I am concerned that you may have sleep disordered breathing with sleep apnea. You will need a sleep study  for further evaluation. Someone will contact you to schedule this.   We discussed how untreated sleep apnea puts an individual at risk for cardiac arrhthymias, pulm HTN, DM, stroke and increases their risk for daytime accidents. We also briefly reviewed treatment options including weight loss, side sleeping position, oral appliance, CPAP therapy or referral to ENT for possible surgical options  Use caution when driving and pull over if you become sleepy.  Follow up in 6 weeks with Katie Quynh Basso,NP to go over sleep study results, or sooner, if needed. Friday PM virtual clinic preferred      Severe obesity (BMI >= 40) (HCC) BMI 44. Healthy weight loss measures. Appropriate candidate for GLP-1 therapy. Will readdress after her HST, as Zepbound would be indicated in OSA. Reviewed small measures to reduce calorie intake, including switching to diet sodas or flavored water.   Anxiety Stable. Associated with life stressors/events. No SI. Suspect this is playing a role in her sleep quality and daytime fatigue as well. Her medication regimen is also causing fatigue, which we discussed today. Encouraged her to follow up with psychiatry or PCP  Smoker Active cigarette and marijuana smoker. Smoking cessation strongly advised. Spent 3 minutes reviewing cessation aids and methods to help with quitting.    Advised if symptoms do not improve or worsen, to please contact office for sooner follow up or seek emergency care.   I spent 45 minutes of dedicated to the care of this patient on the date of this encounter to include pre-visit review of records, face-to-face time with the patient discussing conditions above, post visit ordering of testing, clinical documentation with the electronic health record, making appropriate referrals as documented, and communicating necessary findings to members of the patients care team.  Jodi LULLA Rouleau, NP 05/21/2024  Pt aware and understands NP's role.

## 2024-05-21 NOTE — Assessment & Plan Note (Signed)
 Active cigarette and marijuana smoker. Smoking cessation strongly advised. Spent 3 minutes reviewing cessation aids and methods to help with quitting.

## 2024-06-01 ENCOUNTER — Encounter: Payer: Self-pay | Admitting: Obstetrics & Gynecology

## 2024-06-01 ENCOUNTER — Ambulatory Visit (INDEPENDENT_AMBULATORY_CARE_PROVIDER_SITE_OTHER): Admitting: Obstetrics & Gynecology

## 2024-06-01 ENCOUNTER — Other Ambulatory Visit (HOSPITAL_COMMUNITY)
Admission: RE | Admit: 2024-06-01 | Discharge: 2024-06-01 | Disposition: A | Discharge status: Home / Self Care | Source: Ambulatory Visit | Attending: Obstetrics & Gynecology | Admitting: Obstetrics & Gynecology

## 2024-06-01 VITALS — BP 125/85 | HR 75 | Ht 64.0 in | Wt 256.0 lb

## 2024-06-01 DIAGNOSIS — N3946 Mixed incontinence: Secondary | ICD-10-CM | POA: Diagnosis not present

## 2024-06-01 DIAGNOSIS — N920 Excessive and frequent menstruation with regular cycle: Secondary | ICD-10-CM

## 2024-06-01 DIAGNOSIS — Z1151 Encounter for screening for human papillomavirus (HPV): Secondary | ICD-10-CM

## 2024-06-01 DIAGNOSIS — Z124 Encounter for screening for malignant neoplasm of cervix: Secondary | ICD-10-CM | POA: Diagnosis present

## 2024-06-01 DIAGNOSIS — N946 Dysmenorrhea, unspecified: Secondary | ICD-10-CM

## 2024-06-01 DIAGNOSIS — D219 Benign neoplasm of connective and other soft tissue, unspecified: Secondary | ICD-10-CM

## 2024-06-01 MED ORDER — SOLIFENACIN SUCCINATE 10 MG PO TABS: 10.0000 mg | ORAL_TABLET | Freq: Every day | ORAL | 11 refills | Status: DC

## 2024-06-01 MED ORDER — TRANEXAMIC ACID 650 MG PO TABS: 1300.0000 mg | ORAL_TABLET | Freq: Three times a day (TID) | ORAL | 11 refills | Status: DC

## 2024-06-01 MED ORDER — MEGESTROL ACETATE 40 MG PO TABS: ORAL_TABLET | ORAL | 11 refills | Status: DC

## 2024-06-01 NOTE — Progress Notes (Signed)
 Chief Complaint  Patient presents with   Gynecologic Exam    Wants to discuss hyterectomy      48 y.o. G0P0000 Patient's last menstrual period was 04/23/2024. The current method of family planning is none.  Outpatient Encounter Medications as of 06/01/2024  Medication Sig   Ascorbic Acid (VITAMIN C PO) Take 1 tablet by mouth daily.   Dextromethorphan-buPROPion ER (AUVELITY) 45-105 MG TBCR Take 1 tablet by mouth at bedtime.   escitalopram (LEXAPRO) 10 MG tablet Take 10 mg by mouth daily.   fexofenadine (ALLEGRA) 180 MG tablet Take 180 mg by mouth daily.   megestrol  (MEGACE ) 40 MG tablet 1 daily for 24 days on the 4 days off   omeprazole (PRILOSEC) 40 MG capsule Take 40 mg by mouth daily.   promethazine  (PHENERGAN ) 25 MG tablet Take 1 tablet (25 mg total) by mouth every 6 (six) hours as needed.   solifenacin  (VESICARE ) 10 MG tablet Take 1 tablet (10 mg total) by mouth daily. At bedtime   tranexamic acid  (LYSTEDA ) 650 MG TABS tablet Take 2 tablets (1,300 mg total) by mouth 3 (three) times daily.   vitamin B-12 (CYANOCOBALAMIN) 100 MCG tablet Take 100 mcg by mouth daily.   Vitamin D, Ergocalciferol, (DRISDOL) 1.25 MG (50000 UNIT) CAPS capsule Take 50,000 Units by mouth once a week.   No facility-administered encounter medications on file as of 06/01/2024.    Subjective Pt with lifelong heavy painful periods Always fairly regular Menses age 54  Loses urine coughing + sneezing minimally Mostly can't make it in time Goes every 2 hours at least Gets up 3-4 times per night Smokes and coughs-->bronchitis episodes   Past Medical History:  Diagnosis Date   Anxiety    Arthritis    Depression    Seasonal allergies     Past Surgical History:  Procedure Laterality Date   CHOLECYSTECTOMY     OVARIAN CYST REMOVAL      OB History     Gravida  0   Para  0   Term  0   Preterm  0   AB  0   Living  0      SAB  0   IAB  0   Ectopic  0   Multiple  0   Live  Births  0           Allergies  Allergen Reactions   Citalopram Other (See Comments)    AND HIVES WITH SWELLING   Penicillins Hives, Anaphylaxis, Shortness Of Breath and Swelling    Of tongue and face  AND SWELLING   Buspirone Other (See Comments)   Latex Rash    Social History   Socioeconomic History   Marital status: Divorced    Spouse name: Not on file   Number of children: 0   Years of education: Not on file   Highest education level: 9th grade  Occupational History   Not on file  Tobacco Use   Smoking status: Every Day    Current packs/day: 1.00    Types: Cigarettes   Smokeless tobacco: Not on file  Vaping Use   Vaping status: Never Used  Substance and Sexual Activity   Alcohol use: No   Drug use: Yes    Types: Marijuana   Sexual activity: Yes    Birth control/protection: None  Other Topics Concern   Not on file  Social History Narrative   Not on file   Social Drivers of Health  Financial Resource Strain: Not on file  Food Insecurity: No Food Insecurity (03/26/2022)   Hunger Vital Sign    Worried About Running Out of Food in the Last Year: Never true    Ran Out of Food in the Last Year: Never true  Transportation Needs: No Transportation Needs (03/26/2022)   PRAPARE - Administrator, Civil Service (Medical): No    Lack of Transportation (Non-Medical): No  Physical Activity: Not on file  Stress: Not on file  Social Connections: Not on file    Family History  Problem Relation Age of Onset   COPD Mother    Cancer Brother     Medications:       Current Outpatient Medications:    Ascorbic Acid (VITAMIN C PO), Take 1 tablet by mouth daily., Disp: , Rfl:    Dextromethorphan-buPROPion ER (AUVELITY) 45-105 MG TBCR, Take 1 tablet by mouth at bedtime., Disp: , Rfl:    escitalopram (LEXAPRO) 10 MG tablet, Take 10 mg by mouth daily., Disp: , Rfl:    fexofenadine (ALLEGRA) 180 MG tablet, Take 180 mg by mouth daily., Disp: , Rfl:    megestrol   (MEGACE ) 40 MG tablet, 1 daily for 24 days on the 4 days off, Disp: 24 tablet, Rfl: 11   omeprazole (PRILOSEC) 40 MG capsule, Take 40 mg by mouth daily., Disp: , Rfl:    promethazine  (PHENERGAN ) 25 MG tablet, Take 1 tablet (25 mg total) by mouth every 6 (six) hours as needed., Disp: 15 tablet, Rfl: 0   solifenacin  (VESICARE ) 10 MG tablet, Take 1 tablet (10 mg total) by mouth daily. At bedtime, Disp: 30 tablet, Rfl: 11   tranexamic acid  (LYSTEDA ) 650 MG TABS tablet, Take 2 tablets (1,300 mg total) by mouth 3 (three) times daily., Disp: 30 tablet, Rfl: 11   vitamin B-12 (CYANOCOBALAMIN) 100 MCG tablet, Take 100 mcg by mouth daily., Disp: , Rfl:    Vitamin D, Ergocalciferol, (DRISDOL) 1.25 MG (50000 UNIT) CAPS capsule, Take 50,000 Units by mouth once a week., Disp: , Rfl:   Objective Blood pressure 125/85, pulse 75, height 5' 4 (1.626 m), weight 256 lb (116.1 kg), last menstrual period 04/23/2024.  Physical Exam  Vitals reviewed. Constitutional: She is oriented to person, place, and time. She appears well-developed and well-nourished.  HENT:  Head: Normocephalic and atraumatic.  Right Ear: External ear normal.  Left Ear: External ear normal.  Nose: Nose normal.  Mouth/Throat: Oropharynx is clear and moist.  Eyes: Conjunctivae and EOM are normal. Pupils are equal, round, and reactive to light. Right eye exhibits no discharge. Left eye exhibits no discharge. No scleral icterus.  Neck: Normal range of motion. Neck supple. No tracheal deviation present. No thyromegaly present.  Cardiovascular: Normal rate, regular rhythm, normal heart sounds and intact distal pulses.  Exam reveals no gallop and no friction rub.   No murmur heard. Respiratory: Effort normal and breath sounds normal. No respiratory distress. She has no wheezes. She has no rales. She exhibits no tenderness.  GI: Soft. Bowel sounds are normal. She exhibits no distension and no mass. There is tenderness. There is no rebound and no  guarding.  Genitourinary:       Vulva is normal without lesions Vagina is pink moist without discharge Cervix normal in appearance and pap is normal Uterus is normal shape and consistency Adnexa is negative with normal sized ovaries by sonogram  Musculoskeletal: Normal range of motion. She exhibits no edema and no tenderness.  Neurological: She is  alert and oriented to person, place, and time. She has normal reflexes. She displays normal reflexes. No cranial nerve deficit. She exhibits normal muscle tone. Coordination normal.  Skin: Skin is warm and dry. No rash noted. No erythema. No pallor.  Psychiatric: She has a normal mood and affect. Her behavior is normal. Judgment and thought content normal.    Pertinent ROS No burning with urination, frequency or urgency No nausea, vomiting or diarrhea Nor fever chills or other constitutional symptoms   Labs or studies Reviewed notes labs scans from 2022 (paper)    Impression + Management Plan: Diagnoses this Encounter::   ICD-10-CM   1. Menorrhagia with regular cycle: mostly regular occasionally frequent  N92.0 US  PELVIC COMPLETE WITH TRANSVAGINAL    US  PELVIC COMPLETE WITH TRANSVAGINAL   Megace  cycling + lysteda     2. Dysmenorrhea: severe, responds to ibuprofen but it causes fluid retention  N94.6 US  PELVIC COMPLETE WITH TRANSVAGINAL    US  PELVIC COMPLETE WITH TRANSVAGINAL    3. Mixed incontinence urge and stress: urge primarily, Rx vesicare , stop smoking-->which will decrease coughing and lose weight  N39.46    Did not repsond to trial of ditropan, did not try any other meds    4. Fibroid: 3.9 cm in 2022 sonogram not submucosal by report, will repeat scan ( it's bee 3 1/2 years)  D21.9 US  PELVIC COMPLETE WITH TRANSVAGINAL    US  PELVIC COMPLETE WITH TRANSVAGINAL    5. Routine Papanicolaou smear  Z12.4 Cytology - PAP( [place])        Medications prescribed during  this encounter: Meds ordered this encounter  Medications    solifenacin  (VESICARE ) 10 MG tablet    Sig: Take 1 tablet (10 mg total) by mouth daily. At bedtime    Dispense:  30 tablet    Refill:  11   megestrol  (MEGACE ) 40 MG tablet    Sig: 1 daily for 24 days on the 4 days off    Dispense:  24 tablet    Refill:  11   tranexamic acid  (LYSTEDA ) 650 MG TABS tablet    Sig: Take 2 tablets (1,300 mg total) by mouth 3 (three) times daily.    Dispense:  30 tablet    Refill:  11    Labs or Scans Ordered during this encounter: Orders Placed This Encounter  Procedures   US  PELVIC COMPLETE WITH TRANSVAGINAL      Follow up Return for pelvic sonogram her after Pam returns then see Dr Jayne after scan.

## 2024-06-03 LAB — CYTOLOGY - PAP
Adequacy: ABSENT
Chlamydia: NEGATIVE
Comment: NEGATIVE
Comment: NEGATIVE
Comment: NORMAL
Diagnosis: NEGATIVE
High risk HPV: NEGATIVE
Neisseria Gonorrhea: NEGATIVE

## 2024-06-17 ENCOUNTER — Other Ambulatory Visit: Payer: Self-pay | Admitting: Obstetrics & Gynecology

## 2024-06-22 ENCOUNTER — Ambulatory Visit: Admitting: Obstetrics & Gynecology

## 2024-06-22 ENCOUNTER — Other Ambulatory Visit: Payer: Self-pay

## 2024-06-22 ENCOUNTER — Other Ambulatory Visit: Admitting: Radiology

## 2024-06-22 DIAGNOSIS — I872 Venous insufficiency (chronic) (peripheral): Secondary | ICD-10-CM

## 2024-06-24 ENCOUNTER — Ambulatory Visit (INDEPENDENT_AMBULATORY_CARE_PROVIDER_SITE_OTHER)

## 2024-06-24 ENCOUNTER — Ambulatory Visit: Admitting: Obstetrics & Gynecology

## 2024-06-24 ENCOUNTER — Encounter: Payer: Self-pay | Admitting: Obstetrics & Gynecology

## 2024-06-24 VITALS — BP 114/78 | HR 64 | Ht 64.0 in | Wt 254.0 lb

## 2024-06-24 DIAGNOSIS — N946 Dysmenorrhea, unspecified: Secondary | ICD-10-CM | POA: Diagnosis not present

## 2024-06-24 DIAGNOSIS — D219 Benign neoplasm of connective and other soft tissue, unspecified: Secondary | ICD-10-CM | POA: Diagnosis not present

## 2024-06-24 DIAGNOSIS — N920 Excessive and frequent menstruation with regular cycle: Secondary | ICD-10-CM | POA: Diagnosis not present

## 2024-06-24 DIAGNOSIS — N3946 Mixed incontinence: Secondary | ICD-10-CM

## 2024-06-24 MED ORDER — MIRABEGRON ER 50 MG PO TB24: 50.0000 mg | ORAL_TABLET | Freq: Every day | ORAL | 11 refills | Status: AC

## 2024-06-24 NOTE — Progress Notes (Signed)
 PELVIC US  TA/TV: heterogeneous anteverted uterus,anterior fundal subserosal fibroid 4.4 x 3.4 x 4.4 cm,multiple nabothian cysts,largest complex nabothian cyst 1.7 x 1.8 x 1.6 cm,EEC 3 mm,normal ovaries,no free fluid,no pain during ultrasound,ovaries appear Corporate investment banker

## 2024-06-24 NOTE — Progress Notes (Signed)
 Follow up appointment for results: Sonogram   Chief Complaint  Patient presents with   Follow-up    Megace /Lysteda  for menorrhagia-Ultrasound today Still waking up during night to pee, taking Vesicare     Blood pressure 114/78, pulse 64, height 5' 4 (1.626 m), weight 254 lb (115.2 kg), last menstrual period 04/23/2024.  US  PELVIC COMPLETE WITH TRANSVAGINAL Result Date: 06/24/2024 Images from the original result were not included.  ..an Financial trader of Ultrasound Medicine Technical sales engineer) accredited practice Center for [facility] [address] [place] [address] Ordering Provider: [name], MD                                                                                                                                   GYNECOLOGIC SONOGRAM Jodi Cook is a 48 y.o. G0P0000 unknown LMP. She is here for a pelvic sonogram for menorrhagia/dysmenorrhea. Uterus                      7.3 x 5.1 x 4.3 cm, Total uterine volume 83 cc, heterogeneous anteverted uterus,anterior fundal subserosal fibroid 4.4 x 3.4 x 4.4 cm,multiple nabothian cysts,largest complex nabothian cyst 1.7 x 1.8 x 1.6 cm Endometrium          3 mm, symmetrical, WNL Right ovary             2 x 1.4 x 2 cm, normal Left ovary                2.8 x 1.7 x 2.3 cm, normal No free fluid Technician Comments: PELVIC US  TA/TV: heterogeneous anteverted uterus,anterior fundal subserosal fibroid 4.4 x 3.4 x 4.4 cm,multiple nabothian cysts,largest complex nabothian cyst 1.7 x 1.8 x 1.6 cm,EEC 3 mm,normal ovaries,no free fluid,no pain during ultrasound,ovaries appear mobile [employer] 06/24/2024 9:09 AM Clinical Impression and recommendations: I have reviewed the sonogram results above, combined with the patient's current clinical course, below are my impressions and any appropriate recommendations for management based on the sonographic findings. Uterus normal overall size with fibroid increased in size  from 3.9 cm to 4.4 cm compared to sonogram 3 1/2 years ago Endometrium thin normal Ovaries: normal size shape and morpholgy Vonn Cook Jodi 06/24/2024 10:09 AM     Periods now 2-3 days of pink spotting, no pain on megestrol  24/4 + lysteda   MEDS ordered this encounter: Meds ordered this encounter  Medications   mirabegron ER (MYRBETRIQ) 50 MG TB24 tablet    Sig: Take 1 tablet (50 mg total) by mouth daily.    Dispense:  30 tablet    Refill:  11    Orders for this encounter: No orders of the defined types were placed in this encounter.   Impression + Management Plan   ICD-10-CM   1. Menorrhagia with regular cycle: mostly regular occasionally frequent: dramatically improved with cyclical megestrol  + lysteda , wants to continue  with that regimen  N92.0     2. Dysmenorrhea: severe, responds to ibuprofen but it causes fluid retention: didn't really need any with the cyclical megestrol  + lysteda  regimen  N94.6     3. Mixed incontinence urge and stress: urge primarily, Rx vesicare , stop smoking-->which will decrease coughing and lose weight  N39.46    No better on vesicare .  Will Rx myrbetriq 50 stay on the vesicare , will require PA    4. Fibroid: 4.4 cm uterus 83 cc overall  D21.9       Follow Up: Return in about 3 months (around 09/24/2024) for Follow up, with Dr Jodi.     All questions were answered.  Past Medical History:  Diagnosis Date   Anxiety    Arthritis    Depression    GERD (gastroesophageal reflux disease)    Seasonal allergies     Past Surgical History:  Procedure Laterality Date   CHOLECYSTECTOMY     OVARIAN CYST REMOVAL      OB History     Gravida  0   Para  0   Term  0   Preterm  0   AB  0   Living  0      SAB  0   IAB  0   Ectopic  0   Multiple  0   Live Births  0           Allergies  Allergen Reactions   Citalopram Other (See Comments)    AND HIVES WITH SWELLING   Penicillins Hives, Anaphylaxis, Shortness Of Breath and  Swelling    Of tongue and face  AND SWELLING   Buspirone Other (See Comments)   Latex Rash    Social History   Socioeconomic History   Marital status: Divorced    Spouse name: Not on file   Number of children: 0   Years of education: Not on file   Highest education level: 9th grade  Occupational History   Not on file  Tobacco Use   Smoking status: Every Day    Current packs/day: 1.00    Types: Cigarettes   Smokeless tobacco: Not on file  Vaping Use   Vaping status: Never Used  Substance and Sexual Activity   Alcohol use: No   Drug use: Yes    Types: Marijuana   Sexual activity: Yes    Birth control/protection: None  Other Topics Concern   Not on file  Social History Narrative   Not on file   Social Drivers of Health   Financial Resource Strain: Not on file  Food Insecurity: No Food Insecurity (03/26/2022)   Hunger Vital Sign    Worried About Running Out of Food in the Last Year: Never true    Ran Out of Food in the Last Year: Never true  Transportation Needs: No Transportation Needs (03/26/2022)   PRAPARE - Administrator, Civil Service (Medical): No    Lack of Transportation (Non-Medical): No  Physical Activity: Not on file  Stress: Not on file  Social Connections: Not on file    Family History  Problem Relation Age of Onset   COPD Mother    Cancer Brother

## 2024-06-28 ENCOUNTER — Other Ambulatory Visit: Payer: Self-pay | Admitting: Obstetrics & Gynecology

## 2024-07-06 ENCOUNTER — Ambulatory Visit

## 2024-07-06 DIAGNOSIS — G4719 Other hypersomnia: Secondary | ICD-10-CM

## 2024-07-16 ENCOUNTER — Encounter: Payer: Self-pay | Admitting: Nurse Practitioner

## 2024-07-16 ENCOUNTER — Other Ambulatory Visit (HOSPITAL_COMMUNITY): Payer: Self-pay | Admitting: Obstetrics & Gynecology

## 2024-07-16 ENCOUNTER — Ambulatory Visit (INDEPENDENT_AMBULATORY_CARE_PROVIDER_SITE_OTHER): Admitting: Vascular Surgery

## 2024-07-16 ENCOUNTER — Telehealth: Payer: Self-pay | Admitting: *Deleted

## 2024-07-16 ENCOUNTER — Encounter: Payer: Self-pay | Admitting: Vascular Surgery

## 2024-07-16 ENCOUNTER — Telehealth (INDEPENDENT_AMBULATORY_CARE_PROVIDER_SITE_OTHER): Admitting: Nurse Practitioner

## 2024-07-16 ENCOUNTER — Ambulatory Visit (HOSPITAL_COMMUNITY)
Admission: RE | Admit: 2024-07-16 | Discharge: 2024-07-16 | Disposition: A | Discharge status: Home / Self Care | Source: Ambulatory Visit | Attending: Vascular Surgery | Admitting: Vascular Surgery

## 2024-07-16 VITALS — BP 119/81 | HR 76 | Temp 98.3°F | Resp 20 | Ht 64.0 in | Wt 252.5 lb

## 2024-07-16 DIAGNOSIS — F17200 Nicotine dependence, unspecified, uncomplicated: Secondary | ICD-10-CM

## 2024-07-16 DIAGNOSIS — I872 Venous insufficiency (chronic) (peripheral): Secondary | ICD-10-CM | POA: Insufficient documentation

## 2024-07-16 DIAGNOSIS — G4733 Obstructive sleep apnea (adult) (pediatric): Secondary | ICD-10-CM

## 2024-07-16 DIAGNOSIS — E66813 Obesity, class 3: Secondary | ICD-10-CM

## 2024-07-16 DIAGNOSIS — Z6841 Body Mass Index (BMI) 40.0 and over, adult: Secondary | ICD-10-CM

## 2024-07-16 DIAGNOSIS — F17210 Nicotine dependence, cigarettes, uncomplicated: Secondary | ICD-10-CM

## 2024-07-16 MED ORDER — TRANEXAMIC ACID 650 MG PO TABS: ORAL_TABLET | ORAL | 11 refills | Status: AC

## 2024-07-16 NOTE — Telephone Encounter (Signed)
 Pharmacy needs clarification on Lysteda  prescription. Original prescription was sent in for patient to take 2 tablets three times daily and new prescription is for 1 tablet three times daily. Please send new script if needed.

## 2024-07-16 NOTE — Progress Notes (Signed)
 @Patient  ID: Jodi Cook, female    DOB: [birth year], 48 y.o.   MRN: [id number]  No chief complaint on file.   Referring provider: Leigh Pao, FNP  Virtual Visit via Video Note  I connected with Jodi Cook on 07/16/24 at  4:00 PM EST by a video enabled telemedicine application and verified that I am speaking with the correct person using two identifiers.  Location: Patient: Home Provider: Office   I discussed the limitations of evaluation and management by telemedicine and the availability of in person appointments. The patient expressed understanding and agreed to proceed.  History of Present Illness: 48 year old female, active smoker followed for OSA. Past medical history significant for anxiety, allergic rhinitis, GERD.   TEST/EVENTS:  07/07/2024 HST: AHI 11.5/h, SpO2 low 83%  05/21/2024: Ov with [name] is a 48 year old female who presents with sleep disturbances and daytime fatigue. She experiences restless sleep at night, characterized by frequent awakenings and daytime fatigue. She's unsure if she snores. No morning headaches, drowsy driving, sleepwalking, or sleep paralysis. She occasionally takes trazodone for sleep, but finds it inconsistently effective. She takes Auvelity once a day, which causes drowsiness and excessive sweating. Fatigue was present prior to starting this medication but has worsened since. She has a larger chest contributing to discomfort and difficulty sleeping, as it feels like 'two hundred pounds is sitting on my chest.' She can only sleep on her sides due to the pressure. Planning to talk to a plastic surgeon about this.  She has arthritis in her knees, complicating her ability to exercise, and is aware of the need for knee replacements. This condition, along with her weight, makes physical activity challenging. Her social history includes smoking, which she acknowledges needing to quit, especially given her family history of  COPD. She drinks a two-liter bottle of soda over two to three days, having switched from [facility] to Dr. Nunzio after COVID altered her taste preferences. Her family history is significant for COPD, as her mother passed away from it. She also mentions the emotional toll of losing her parents and grandmother, impacting her mental health and lifestyle. Feels this may play into her fatigue.  She goes to bed around 11pm-midnight. Falls asleep quickly. Wakes 4-5 times a night. Gets up at 6 am. Weight is up 15 lb in last 2 years. 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 No former sleep study. 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 Lives with her boyfriend. No sleep aids. Has taken trazodone in the past but didn't work well for her.  [place] 5  07/16/2024: Today - follow up Discussed the use of AI scribe software for clinical note transcription with the patient, who gave verbal consent to proceed.  History of Present Illness Jodi Cook is a 48 year old female who presents for follow up after home sleep study.  She has mild sleep apnea confirmed by a sleep study. She experiences chronic sleep deprivation and reports difficulty losing weight. She is dissatisfied with her current weight, noting it is the highest it has been in a long time, and attributes this partly to stress. She feels tired all the time. Sleep is not restorative. No drowsy driving.   She is an active smoker. She wants to quit. She is currently on Wellbutrin but reports it has not been effective in helping her quit smoking. She does have significant anxiety and is followed by psychiatry. No SI/HI.  Allergies  Allergen Reactions   Citalopram Other (See Comments)    AND HIVES WITH SWELLING   Penicillins Hives, Anaphylaxis, Shortness Of Breath and Swelling    Of tongue and face  AND SWELLING   Buspirone Other (See Comments)   Latex Rash     There is no immunization history on file for this patient.  Past Medical  History:  Diagnosis Date   Anxiety    Arthritis    Depression    GERD (gastroesophageal reflux disease)    Seasonal allergies     Tobacco History: Social History   Tobacco Use  Smoking Status Every Day   Current packs/day: 1.00   Types: Cigarettes  Smokeless Tobacco Not on file   Ready to quit: Not Answered Counseling given: Not Answered   Outpatient Medications Prior to Visit  Medication Sig Dispense Refill   Ascorbic Acid (VITAMIN C PO) Take 1 tablet by mouth daily.     Dextromethorphan-buPROPion ER (AUVELITY) 45-105 MG TBCR Take 1 tablet by mouth at bedtime.     escitalopram (LEXAPRO) 10 MG tablet Take 10 mg by mouth daily.     fexofenadine (ALLEGRA) 180 MG tablet Take 180 mg by mouth daily.     megestrol  (MEGACE ) 40 MG tablet TAKE 1 TABLET BY MOUTH FOR 24 DAYS ON THEN 4 DAYS OFF 24 tablet 11   mirabegron ER (MYRBETRIQ) 50 MG TB24 tablet Take 1 tablet (50 mg total) by mouth daily. 30 tablet 11   omeprazole (PRILOSEC) 40 MG capsule Take 40 mg by mouth daily.     promethazine  (PHENERGAN ) 25 MG tablet Take 1 tablet (25 mg total) by mouth every 6 (six) hours as needed. 15 tablet 0   solifenacin  (VESICARE ) 10 MG tablet TAKE 1 TABLET BY MOUTH ONCE DAILY AT BEDTIME 90 tablet 3   tranexamic acid  (LYSTEDA ) 650 MG TABS tablet TAKE 1 TABLET BY MOUTH 3 TIMES DAILY *NEW PRESCRIPTION REQUEST* 270 tablet 11   vitamin B-12 (CYANOCOBALAMIN) 100 MCG tablet Take 100 mcg by mouth daily.     Vitamin D, Ergocalciferol, (DRISDOL) 1.25 MG (50000 UNIT) CAPS capsule Take 50,000 Units by mouth once a week.     No facility-administered medications prior to visit.     Review of Systems: As above    Physical Exam:  Patient is well-developed, well-nourished in no acute distress.  Resting comfortably at home.  No labored breathing.  Speech is clear and coherent with logical content.  Patient is alert and oriented at baseline.     Lab Results:  CBC    Component Value Date/Time   WBC  9.6 12/11/2022 1655   RBC 4.12 12/11/2022 1655   HGB 12.8 12/11/2022 1655   HGB 14.3 01/09/2014 1158   HCT 37.7 12/11/2022 1655   HCT 41.5 01/09/2014 1158   PLT 246 12/11/2022 1655   PLT 183 01/09/2014 1158   MCV 91.5 12/11/2022 1655   MCV 92 01/09/2014 1158   MCH 31.1 12/11/2022 1655   MCHC 34.0 12/11/2022 1655   RDW 12.7 12/11/2022 1655   RDW 12.9 01/09/2014 1158   LYMPHSABS 3.3 12/11/2022 1655   LYMPHSABS 2.9 01/09/2014 1158   MONOABS 0.8 12/11/2022 1655   MONOABS 0.5 01/09/2014 1158   EOSABS 0.4 12/11/2022 1655   EOSABS 0.3 01/09/2014 1158   BASOSABS 0.0 12/11/2022 1655   BASOSABS 0.0 01/09/2014 1158    BMET    Component Value Date/Time   NA 136 12/11/2022 1655   NA 138 01/09/2014 1158  K 3.4 (L) 12/11/2022 1655   K 3.7 01/09/2014 1158   CL 104 12/11/2022 1655   CL 105 01/09/2014 1158   CO2 24 12/11/2022 1655   CO2 28 01/09/2014 1158   GLUCOSE 80 12/11/2022 1655   GLUCOSE 99 01/09/2014 1158   BUN 9 12/11/2022 1655   BUN 7 01/09/2014 1158   CREATININE 0.55 12/11/2022 1655   CREATININE 0.75 01/09/2014 1158   CALCIUM 8.6 (L) 12/11/2022 1655   CALCIUM 8.6 01/09/2014 1158   GFRNONAA >60 12/11/2022 1655   GFRNONAA >60 01/09/2014 1158   GFRAA >90 11/11/2014 1110   GFRAA >60 01/09/2014 1158    BNP    Component Value Date/Time   BNP 13.0 12/11/2022 1654     Assessment & Plan:   Mild obstructive sleep apnea Mild OSA. Minimal cardiovascular risks associated with mild severity sleep apnea; however, she has significant daytime burden. Given this, shared decision to move forward with CPAP therapy. Orders placed for new start CPAP 5-15 cmH2O, mask of choice and heated humidity. Educated on proper use/care of device. Risks/benefits reviewed. Healthy weight loss strongly encouraged. Would not be a candidate for Zepbound given mild OSA. Medicaid not covering GLP-1 therapy for weight management at this time due to funding. Can reassess in the future. Safe driving  practices reviewed.  Patient Instructions  Start CPAP every night, minimum of 4-6 hours a night.  Change equipment as directed. Wash your tubing with warm soap and water daily, hang to dry. Wash humidifier portion weekly. Use bottled, distilled water and change daily Be aware of reduced alertness and do not drive or operate heavy machinery if experiencing this or drowsiness.  Exercise encouraged, as tolerated. Healthy weight management discussed.  Avoid or decrease alcohol consumption and medications that make you more sleepy, if possible. Notify if persistent daytime sleepiness occurs even with consistent use of PAP therapy.  Change CPAP supplies... Every month Mask cushions and/or nasal pillows CPAP machine filters Every 3 months Mask frame (not including the headgear) CPAP tubing Every 6 months Mask headgear Chin strap (if applicable) Humidifier water tub  We discussed how untreated sleep apnea puts an individual at risk for cardiac arrhthymias, pulm HTN, DM, stroke and increases their risk for daytime accidents; although, these are minimal with mild severity sleep apnea. We also briefly reviewed treatment options including weight loss, side sleeping position, oral appliance, CPAP therapy  Talk to your psychiatrist about Chantix for quitting smoking. If they are comfortable with you taking this, I am happy to prescribe it for you. Otherwise, I can refer you to the smoking cessation program  Referral to medical weight management placed today   Follow up 1/9 in [place]. You should get a call or a MyChart message to confirm this appt. If you haven't within the next week, call our office. If symptoms do not improve or worsen, please contact office for sooner follow up or seek emergency care.     Severe obesity (BMI >= 40) (HCC) See above. Referred to medical weight management.   Smoker Consider Chantix. Would like psychiatry's input given mental health hx. No hx of SI. She will  let us  know after discussing with them. If not preferred, can send referral to smoking cessation program.     I discussed the assessment and treatment plan with the patient. The patient was provided an opportunity to ask questions and all were answered. The patient agreed with the plan and demonstrated an understanding of the instructions.   The patient  was advised to call back or seek an in-person evaluation if the symptoms worsen or if the condition fails to improve as anticipated.  I provided 35 minutes of non-face-to-face time during this encounter.  Comer LULLA Rouleau, NP 07/16/2024  Pt aware and understands NP's role.

## 2024-07-16 NOTE — Assessment & Plan Note (Addendum)
 See above. Referred to medical weight management.

## 2024-07-16 NOTE — Patient Instructions (Signed)
 Start CPAP every night, minimum of 4-6 hours a night.  Change equipment as directed. Wash your tubing with warm soap and water daily, hang to dry. Wash humidifier portion weekly. Use bottled, distilled water and change daily Be aware of reduced alertness and do not drive or operate heavy machinery if experiencing this or drowsiness.  Exercise encouraged, as tolerated. Healthy weight management discussed.  Avoid or decrease alcohol consumption and medications that make you more sleepy, if possible. Notify if persistent daytime sleepiness occurs even with consistent use of PAP therapy.  Change CPAP supplies... Every month Mask cushions and/or nasal pillows CPAP machine filters Every 3 months Mask frame (not including the headgear) CPAP tubing Every 6 months Mask headgear Chin strap (if applicable) Humidifier water tub  We discussed how untreated sleep apnea puts an individual at risk for cardiac arrhthymias, pulm HTN, DM, stroke and increases their risk for daytime accidents; although, these are minimal with mild severity sleep apnea. We also briefly reviewed treatment options including weight loss, side sleeping position, oral appliance, CPAP therapy  Talk to your psychiatrist about Chantix for quitting smoking. If they are comfortable with you taking this, I am happy to prescribe it for you. Otherwise, I can refer you to the smoking cessation program  Referral to medical weight management placed today   Follow up 1/9 in [place]. You should get a call or a MyChart message to confirm this appt. If you haven't within the next week, call our office. If symptoms do not improve or worsen, please contact office for sooner follow up or seek emergency care.

## 2024-07-16 NOTE — Progress Notes (Signed)
 Patient ID: Jodi Cook, female   DOB: 1976-05-23, 48 y.o.   MRN: [id number]  Reason for Consult: New Patient (Initial Visit)   Referred by Leigh Pao, FNP  Subjective:     HPI  Jodi Cook is a 48 y.o. female who presents for evaluation of lower extremity swelling Timeframe: A few years Symptoms: Aching and heaviness Varicosities: No Previous wounds: No Previous DVT: No In compression: No  Past Medical History:  Diagnosis Date   Anxiety    Arthritis    Depression    GERD (gastroesophageal reflux disease)    Seasonal allergies    Family History  Problem Relation Age of Onset   COPD Mother    Cancer Brother    Past Surgical History:  Procedure Laterality Date   CHOLECYSTECTOMY     OVARIAN CYST REMOVAL      Short Social History:  Social History   Tobacco Use   Smoking status: Every Day    Current packs/day: 1.00    Types: Cigarettes   Smokeless tobacco: Not on file  Substance Use Topics   Alcohol use: No    Allergies  Allergen Reactions   Citalopram Other (See Comments)    AND HIVES WITH SWELLING   Penicillins Hives, Anaphylaxis, Shortness Of Breath and Swelling    Of tongue and face  AND SWELLING   Buspirone Other (See Comments)   Latex Rash    Current Outpatient Medications  Medication Sig Dispense Refill   Ascorbic Acid (VITAMIN C PO) Take 1 tablet by mouth daily.     Dextromethorphan-buPROPion ER (AUVELITY) 45-105 MG TBCR Take 1 tablet by mouth at bedtime.     escitalopram (LEXAPRO) 10 MG tablet Take 10 mg by mouth daily.     fexofenadine (ALLEGRA) 180 MG tablet Take 180 mg by mouth daily.     megestrol  (MEGACE ) 40 MG tablet TAKE 1 TABLET BY MOUTH FOR 24 DAYS ON THEN 4 DAYS OFF 24 tablet 11   mirabegron ER (MYRBETRIQ) 50 MG TB24 tablet Take 1 tablet (50 mg total) by mouth daily. 30 tablet 11   omeprazole (PRILOSEC) 40 MG capsule Take 40 mg by mouth daily.     promethazine  (PHENERGAN ) 25 MG tablet Take 1 tablet (25 mg total) by mouth  every 6 (six) hours as needed. 15 tablet 0   solifenacin  (VESICARE ) 10 MG tablet TAKE 1 TABLET BY MOUTH ONCE DAILY AT BEDTIME 90 tablet 3   tranexamic acid  (LYSTEDA ) 650 MG TABS tablet TAKE 1 TABLET BY MOUTH 3 TIMES DAILY *NEW PRESCRIPTION REQUEST* 270 tablet 11   vitamin B-12 (CYANOCOBALAMIN) 100 MCG tablet Take 100 mcg by mouth daily.     Vitamin D, Ergocalciferol, (DRISDOL) 1.25 MG (50000 UNIT) CAPS capsule Take 50,000 Units by mouth once a week.     No current facility-administered medications for this visit.    REVIEW OF SYSTEMS All other systems were reviewed and are negative    Objective:  Objective   Vitals:   07/16/24 1349  BP: 119/81  Pulse: 76  Resp: 20  Temp: 98.3 F (36.8 C)  TempSrc: Temporal  SpO2: 98%  Weight: 252 lb 8 oz (114.5 kg)  Height: 5' 4 (1.626 m)   Body mass index is 43.34 kg/m.  Physical Exam General: no acute distress Cardiac: hemodynamically stable Extremities: Edema from ankles to mid thighs bilaterally Vascular:   Right: palpable DP, PT  Left: palpable DP, PT   Data: Reflux study +--------------+---------+------+-----------+------------+--------+  RIGHT  Reflux NoRefluxReflux TimeDiameter cmsComments                          Yes                                   +--------------+---------+------+-----------+------------+--------+  CFV                    yes   >1 second                       +--------------+---------+------+-----------+------------+--------+  FV mid        no                                              +--------------+---------+------+-----------+------------+--------+  Popliteal    no                                              +--------------+---------+------+-----------+------------+--------+  GSV at [facility]    no                            0.78              +--------------+---------+------+-----------+------------+--------+  GSV prox thighno                             0.49              +--------------+---------+------+-----------+------------+--------+  GSV mid thigh           yes    >500 ms      0.44              +--------------+---------+------+-----------+------------+--------+  GSV dist thighno                            0.41              +--------------+---------+------+-----------+------------+--------+  GSV at knee   no                            0.34              +--------------+---------+------+-----------+------------+--------+  GSV prox calf no                            0.28              +--------------+---------+------+-----------+------------+--------+  GSV mid calf  no                            0.27              +--------------+---------+------+-----------+------------+--------+  SSV at [facility]    no                            0.52              +--------------+---------+------+-----------+------------+--------+  SSV prox calf no                            0.3               +--------------+---------+------+-----------+------------+--------+       Assessment/Plan:     Jodi Cook is a 48 y.o. female with chronic venous insufficiency with C3 disease and reflux noted in mid thigh GSV and CFV I explained the foundation of CVI treatment of compression and elevation I recommended medical grade graduated compression stockings and intermittent leg elevation.  We also discussed exercise and weight loss.   Follow-up as needed     Norman GORMAN Serve MD [facility]

## 2024-07-16 NOTE — Assessment & Plan Note (Signed)
 Consider Chantix. Would like psychiatry's input given mental health hx. No hx of SI. She will let us  know after discussing with them. If not preferred, can send referral to smoking cessation program.

## 2024-07-16 NOTE — Assessment & Plan Note (Signed)
 Mild OSA. Minimal cardiovascular risks associated with mild severity sleep apnea; however, she has significant daytime burden. Given this, shared decision to move forward with CPAP therapy. Orders placed for new start CPAP 5-15 cmH2O, mask of choice and heated humidity. Educated on proper use/care of device. Risks/benefits reviewed. Healthy weight loss strongly encouraged. Would not be a candidate for Zepbound given mild OSA. Medicaid not covering GLP-1 therapy for weight management at this time due to funding. Can reassess in the future. Safe driving practices reviewed.  Patient Instructions  Start CPAP every night, minimum of 4-6 hours a night.  Change equipment as directed. Wash your tubing with warm soap and water daily, hang to dry. Wash humidifier portion weekly. Use bottled, distilled water and change daily Be aware of reduced alertness and do not drive or operate heavy machinery if experiencing this or drowsiness.  Exercise encouraged, as tolerated. Healthy weight management discussed.  Avoid or decrease alcohol consumption and medications that make you more sleepy, if possible. Notify if persistent daytime sleepiness occurs even with consistent use of PAP therapy.  Change CPAP supplies... Every month Mask cushions and/or nasal pillows CPAP machine filters Every 3 months Mask frame (not including the headgear) CPAP tubing Every 6 months Mask headgear Chin strap (if applicable) Humidifier water tub  We discussed how untreated sleep apnea puts an individual at risk for cardiac arrhthymias, pulm HTN, DM, stroke and increases their risk for daytime accidents; although, these are minimal with mild severity sleep apnea. We also briefly reviewed treatment options including weight loss, side sleeping position, oral appliance, CPAP therapy  Talk to your psychiatrist about Chantix for quitting smoking. If they are comfortable with you taking this, I am happy to prescribe it for you. Otherwise,  I can refer you to the smoking cessation program  Referral to medical weight management placed today   Follow up 1/9 in [place]. You should get a call or a MyChart message to confirm this appt. If you haven't within the next week, call our office. If symptoms do not improve or worsen, please contact office for sooner follow up or seek emergency care.

## 2024-07-19 DIAGNOSIS — G4733 Obstructive sleep apnea (adult) (pediatric): Secondary | ICD-10-CM | POA: Diagnosis not present

## 2024-07-22 ENCOUNTER — Encounter (INDEPENDENT_AMBULATORY_CARE_PROVIDER_SITE_OTHER): Payer: Self-pay

## 2024-07-22 ENCOUNTER — Encounter: Payer: Self-pay | Admitting: Nurse Practitioner

## 2024-07-23 ENCOUNTER — Telehealth: Payer: Self-pay

## 2024-07-23 DIAGNOSIS — F17200 Nicotine dependence, unspecified, uncomplicated: Secondary | ICD-10-CM

## 2024-07-23 NOTE — Telephone Encounter (Signed)
 Mychart message:  Hey Dr Malachy I was wondering if you would call me in the prescription for the nicotine patches for me I really want to quit smoking and get started on this so I can have a healthier life,Thank You   Is this okay for me to do and if so what MG please advise

## 2024-07-23 NOTE — Telephone Encounter (Signed)
 Atc x1 to verify ppd and no answer left detailed message on vm and to return call

## 2024-07-23 NOTE — Telephone Encounter (Signed)
 Can you verify how many ppd/cigarettes per day she is smoking now? Thanks

## 2024-07-27 NOTE — Telephone Encounter (Signed)
 She smokes 1 PPD

## 2024-07-28 MED ORDER — NICOTINE 21 MG/24HR TD PT24: 21.0000 mg | MEDICATED_PATCH | Freq: Every day | TRANSDERMAL | 2 refills | Status: AC

## 2024-07-28 NOTE — Telephone Encounter (Signed)
 Sent 21 mcg patch to the pharmacy. Replace every 24 hours and rotate site each time. If it gives her nightmares/bizarre dreams, she can remove at night and replace in the morning. If she decreases her smoking down to less than 1/2 pack per day after 6 weeks using the 21 mcg patch, we can go to the 14 mcg patch after this. Thanks.

## 2024-07-29 NOTE — Telephone Encounter (Signed)
 Informed pt on [facility] recs regarding patches - pt states understanding and also mentioned concern for her cpap she sated they called her last week and said they where waiting for everything to clear from dme- informed pt to call the number back that called her to get an update and to let us  know if we can assist further

## 2024-08-18 ENCOUNTER — Telehealth: Payer: Self-pay | Admitting: Nurse Practitioner

## 2024-08-18 NOTE — Telephone Encounter (Signed)
 Spoke with patient and informed her I had spoke with [facility] and they were waiting for the provider to sign the sleep study report and that was taken care of this morning.  Request will be sent to the intake department and the patient should hear from [facility] by the end of the week---is not she is to call us  back

## 2024-08-18 NOTE — Telephone Encounter (Signed)
 Copied from CRM [id number]. Topic: Clinical - Order For Equipment >> Aug 17, 2024 12:38 PM Rilla B wrote: Reason for CRM: Patient calling because she has not heard anything on her CPAP machine. States she does not know what company the order was sent from. I am unsuccessful and finding the company name in chart.  Please call patient @ [phone]   ----------------------------------------------------------------------- From previous Reason for Contact - Other: Reason for CRM:

## 2024-09-17 ENCOUNTER — Ambulatory Visit: Admitting: Nurse Practitioner
